# Patient Record
Sex: Male | Born: 1980 | Race: Black or African American | Hispanic: No | Marital: Single | Smoking: Former smoker
Health system: Southern US, Community
[De-identification: ages and names within clinical notes are randomized; demographics above are authoritative.]

## PROBLEM LIST (undated history)

## (undated) ENCOUNTER — Emergency Department (HOSPITAL_COMMUNITY): Admission: EM | Payer: Self-pay | Source: Home / Self Care

## (undated) DIAGNOSIS — N200 Calculus of kidney: Secondary | ICD-10-CM

---

## 2014-04-23 ENCOUNTER — Encounter (HOSPITAL_COMMUNITY): Payer: Self-pay | Admitting: Emergency Medicine

## 2014-04-23 ENCOUNTER — Emergency Department (HOSPITAL_COMMUNITY)
Admission: EM | Admit: 2014-04-23 | Discharge: 2014-04-23 | Disposition: A | Discharge status: Home / Self Care | Payer: Self-pay | Attending: Emergency Medicine | Admitting: Emergency Medicine

## 2014-04-23 DIAGNOSIS — M549 Dorsalgia, unspecified: Secondary | ICD-10-CM | POA: Insufficient documentation

## 2014-04-23 DIAGNOSIS — R109 Unspecified abdominal pain: Secondary | ICD-10-CM

## 2014-04-23 DIAGNOSIS — N1330 Unspecified hydronephrosis: Secondary | ICD-10-CM | POA: Insufficient documentation

## 2014-04-23 DIAGNOSIS — R319 Hematuria, unspecified: Secondary | ICD-10-CM | POA: Insufficient documentation

## 2014-04-23 DIAGNOSIS — R000 Tachycardia, unspecified: Secondary | ICD-10-CM | POA: Insufficient documentation

## 2014-04-23 DIAGNOSIS — R112 Nausea with vomiting, unspecified: Secondary | ICD-10-CM | POA: Insufficient documentation

## 2014-04-23 LAB — URINALYSIS, ROUTINE W REFLEX MICROSCOPIC
Bilirubin Urine: NEGATIVE
Glucose, UA: NEGATIVE mg/dL
Ketones, ur: NEGATIVE mg/dL
NITRITE: NEGATIVE
Protein, ur: 30 mg/dL — AB
SPECIFIC GRAVITY, URINE: 1.017 (ref 1.005–1.030)
UROBILINOGEN UA: 1 mg/dL (ref 0.0–1.0)
pH: 6.5 (ref 5.0–8.0)

## 2014-04-23 LAB — URINE MICROSCOPIC-ADD ON

## 2014-04-23 MED ORDER — KETOROLAC TROMETHAMINE 60 MG/2ML IM SOLN: 60.0000 mg | Freq: Once | INTRAMUSCULAR | Status: DC

## 2014-04-23 MED ORDER — TAMSULOSIN HCL 0.4 MG PO CAPS: 0.4000 mg | ORAL_CAPSULE | Freq: Every day | ORAL | Status: DC

## 2014-04-23 MED ORDER — HYDROCODONE-ACETAMINOPHEN 5-325 MG PO TABS: 2.0000 | ORAL_TABLET | ORAL | Status: DC | PRN

## 2014-04-23 MED ORDER — ONDANSETRON 4 MG PO TBDP
4.0000 mg | ORAL_TABLET | Freq: Once | ORAL | Status: AC
  Administered 2014-04-23: 4 mg via ORAL
  Filled 2014-04-23: qty 1

## 2014-04-23 MED ORDER — ONDANSETRON 4 MG PO TBDP: ORAL_TABLET | ORAL | Status: DC

## 2014-04-23 MED ORDER — IBUPROFEN 800 MG PO TABS
800.0000 mg | ORAL_TABLET | Freq: Once | ORAL | Status: AC
  Administered 2014-04-23: 800 mg via ORAL
  Filled 2014-04-23: qty 1

## 2014-04-23 NOTE — ED Provider Notes (Signed)
CSN: [id number]     Arrival date & time 04/23/14  0231 History   First MD Initiated Contact with Patient 04/23/14 0234     Chief Complaint  Patient presents with  . Flank Pain     (Consider location/radiation/quality/duration/timing/severity/associated sxs/prior Treatment) HPI Comments: 33 year old male with no significant medical history presents with vomiting and right flank pain. Patient has had intermittent right lower back pain for the past couple weeks, worse with movement. Patient has had mild dysuria without hematuria. No blood in stool. No significant alcohol history. Tonight the back pain acutely worsened followed by 3 episodes of nonbloody vomiting.  Patient had close contact with patients with vomiting over week ago. Patient denies recent travel or new foods. No abdominal pain. Mild radiation towards the groin without testicle pain.  Patient is a 33 y.o. male presenting with flank pain. The history is provided by the patient.  Flank Pain Pertinent negatives include no chest pain, no abdominal pain, no headaches and no shortness of breath.    History reviewed. No pertinent past medical history. No past surgical history on file. No family history on file. History  Substance Use Topics  . Smoking status: Not on file  . Smokeless tobacco: Not on file  . Alcohol Use: Not on file    Review of Systems  Constitutional: Negative for fever and chills.  HENT: Negative for congestion.   Respiratory: Negative for shortness of breath.   Cardiovascular: Negative for chest pain.  Gastrointestinal: Positive for nausea and vomiting. Negative for abdominal pain.  Genitourinary: Positive for dysuria and flank pain.  Musculoskeletal: Positive for back pain. Negative for neck pain and neck stiffness.  Skin: Negative for rash.  Neurological: Negative for light-headedness and headaches.      Allergies  Review of patient's allergies indicates no known allergies.  Home Medications    Prior to Admission medications   Not on File   BP 121/76  Pulse 101  Temp(Src) 98.2 F (36.8 C) (Oral)  Resp 16  SpO2 98% Physical Exam  Nursing note and vitals reviewed. Constitutional: He is oriented to person, place, and time. He appears well-developed and well-nourished.  HENT:  Head: Normocephalic and atraumatic.  Mild dry mucous membranes  Eyes: Conjunctivae are normal. Right eye exhibits no discharge. Left eye exhibits no discharge.  Neck: Normal range of motion. Neck supple. No tracheal deviation present.  Cardiovascular: Regular rhythm.  Tachycardia present.   Pulmonary/Chest: Effort normal and breath sounds normal.  Abdominal: Soft. He exhibits no distension. There is no tenderness. There is no guarding.  Musculoskeletal: He exhibits tenderness (mild right flank and right lumbar paraspinal.). He exhibits no edema.  Neurological: He is alert and oriented to person, place, and time.  Skin: Skin is warm. No rash noted.  Psychiatric: He has a normal mood and affect.    ED Course  Procedures (including critical care time) Emergency Focused Ultrasound Exam Limited retroperitoneal ultrasound of kidneys  Performed and interpreted by Dr. Jodi MourningZavitz Indication: flank pain Focused abdominal ultrasound with both kidneys imaged in transverse and longitudinal planes in real-time. Interpretation: Mild right hydronephrosis visualized.  No stones or cysts visualized  Images archived electronically  Labs Review Labs Reviewed  URINALYSIS, ROUTINE W REFLEX MICROSCOPIC - Abnormal; Notable for the following:    Hgb urine dipstick LARGE (*)    Protein, ur 30 (*)    Leukocytes, UA TRACE (*)    All other components within normal limits  URINE MICROSCOPIC-ADD ON    Imaging Review  No results found.   EKG Interpretation None      MDM   Final diagnoses:  Right flank pain  Hematuria  Hydronephrosis, right   Clinically patient with kidney stone versus musculoskeletal versus  gastritis versus other. Plan for Toradol, Zofran, fluid challenge and bedside ultrasound of the kidneys. No abdominal pain at this time to suggest appendicitis.  Patient with hematuria right flank pain and mild hydronephrosis on ultrasound. Pain and vomiting control. No indication for CT scan at this time with risks involved of cost and radiation. Supportive care and followup with primary doctor and neurology discussed. Strict reasons to return given.  Results and differential diagnosis were discussed with the patient. Close follow up outpatient was discussed, patient comfortable with the plan.   Filed Vitals:   04/23/14 0236 04/23/14 0245  BP: 134/91 121/76  Pulse: 107 101  Temp: 98.2 F (36.8 C)   TempSrc: Oral   Resp: 16   SpO2: 99% 98%      Enid SkeensJoshua M Kayann Maj, MD 04/23/14 [phone]

## 2014-04-23 NOTE — Discharge Instructions (Signed)
Take ibuprofen 600 mg every 6 hours for pain. For severe pain take norco or vicodin however realize they have the potential for addiction and it can make you sleepy and has tylenol in it.  No operating machinery while taking. Return for fevers, uncontrollable pain or new symptoms. Followup with urology if no improvement. Stable hydrated with water and strain her urine.  If you were given medicines take as directed.  If you are on coumadin or contraceptives realize their levels and effectiveness is altered by many different medicines.  If you have any reaction (rash, tongues swelling, other) to the medicines stop taking and see a physician.   Please follow up as directed and return to the ER or see a physician for new or worsening symptoms.  Thank you. Filed Vitals:   04/23/14 0236 04/23/14 0245  BP: 134/91 121/76  Pulse: 107 101  Temp: 98.2 F (36.8 C)   TempSrc: Oral   Resp: 16   SpO2: 99% 98%    Flank Pain Flank pain is pain in your side. The flank is the area of your side between your upper belly (abdomen) and your back. Pain in this area can be caused by many different things. HOME CARE Home care and treatment will depend on the cause of your pain.  Rest as told by your doctor.  Drink enough fluids to keep your pee (urine) clear or pale yellow.  Only take medicine as told by your doctor.  Tell your doctor about any changes in your pain.  Follow up with your doctor. GET HELP RIGHT AWAY IF:   Your pain does not get better with medicine.   You have new symptoms or your symptoms get worse.  Your pain gets worse.   You have belly (abdominal) pain.   You are short of breath.   You always feel sick to your stomach (nauseous).   You keep throwing up (vomiting).   You have puffiness (swelling) in your belly.   You feel lightheaded or you pass out (faint).   You have blood in your pee.  You have a fever or lasting symptoms for more than 2 3 days.  You have  a fever and your symptoms suddenly get worse. MAKE SURE YOU:   Understand these instructions.  Will watch your condition.  Will get help right away if you are not doing well or get worse. Document Released: 09/20/2008 Document Revised: 09/05/2012 Document Reviewed: 07/26/2012 [facility] Patient Information 2014 [place], [place].

## 2014-04-23 NOTE — ED Notes (Signed)
Pt c/o intermittent right flank pain for a couple of weeks. Pt reports intermittent dysuria, denies hematuria. Pt reports normal BM's. Pt states he has vomited x 3 today.

## 2014-04-23 NOTE — ED Notes (Signed)
MD at bedside. 

## 2014-04-25 ENCOUNTER — Emergency Department (HOSPITAL_COMMUNITY)
Admission: EM | Admit: 2014-04-25 | Discharge: 2014-04-25 | Disposition: A | Discharge status: Home / Self Care | Payer: Self-pay | Attending: Emergency Medicine | Admitting: Emergency Medicine

## 2014-04-25 ENCOUNTER — Encounter (HOSPITAL_COMMUNITY): Payer: Self-pay | Admitting: Emergency Medicine

## 2014-04-25 ENCOUNTER — Emergency Department (HOSPITAL_COMMUNITY): Payer: Self-pay

## 2014-04-25 DIAGNOSIS — Z79899 Other long term (current) drug therapy: Secondary | ICD-10-CM | POA: Insufficient documentation

## 2014-04-25 DIAGNOSIS — F17200 Nicotine dependence, unspecified, uncomplicated: Secondary | ICD-10-CM | POA: Insufficient documentation

## 2014-04-25 DIAGNOSIS — N200 Calculus of kidney: Secondary | ICD-10-CM | POA: Insufficient documentation

## 2014-04-25 LAB — URINALYSIS, ROUTINE W REFLEX MICROSCOPIC
Bilirubin Urine: NEGATIVE
Glucose, UA: NEGATIVE mg/dL
Ketones, ur: NEGATIVE mg/dL
NITRITE: NEGATIVE
Protein, ur: 100 mg/dL — AB
SPECIFIC GRAVITY, URINE: 1.026 (ref 1.005–1.030)
UROBILINOGEN UA: 1 mg/dL (ref 0.0–1.0)
pH: 5.5 (ref 5.0–8.0)

## 2014-04-25 LAB — CBC WITH DIFFERENTIAL/PLATELET
BASOS ABS: 0 10*3/uL (ref 0.0–0.1)
BASOS PCT: 1 % (ref 0–1)
Eosinophils Absolute: 0.4 10*3/uL (ref 0.0–0.7)
Eosinophils Relative: 7 % — ABNORMAL HIGH (ref 0–5)
HCT: 42.5 % (ref 39.0–52.0)
HEMOGLOBIN: 14.2 g/dL (ref 13.0–17.0)
Lymphocytes Relative: 18 % (ref 12–46)
Lymphs Abs: 1 10*3/uL (ref 0.7–4.0)
MCH: 29.3 pg (ref 26.0–34.0)
MCHC: 33.4 g/dL (ref 30.0–36.0)
MCV: 87.6 fL (ref 78.0–100.0)
MONOS PCT: 12 % (ref 3–12)
Monocytes Absolute: 0.7 10*3/uL (ref 0.1–1.0)
NEUTROS ABS: 3.6 10*3/uL (ref 1.7–7.7)
Neutrophils Relative %: 64 % (ref 43–77)
PLATELETS: 321 10*3/uL (ref 150–400)
RBC: 4.85 MIL/uL (ref 4.22–5.81)
RDW: 13.9 % (ref 11.5–15.5)
WBC: 5.6 10*3/uL (ref 4.0–10.5)

## 2014-04-25 LAB — COMPREHENSIVE METABOLIC PANEL
ALT: 16 U/L (ref 0–53)
AST: 44 U/L — ABNORMAL HIGH (ref 0–37)
Albumin: 3.3 g/dL — ABNORMAL LOW (ref 3.5–5.2)
Alkaline Phosphatase: 87 U/L (ref 39–117)
BUN: 16 mg/dL (ref 6–23)
CALCIUM: 9.5 mg/dL (ref 8.4–10.5)
CO2: 27 meq/L (ref 19–32)
CREATININE: 1.05 mg/dL (ref 0.50–1.35)
Chloride: 102 mEq/L (ref 96–112)
GFR calc Af Amer: >90 mL/min (ref >90)
Glucose, Bld: 133 mg/dL — ABNORMAL HIGH (ref 70–99)
Potassium: 3.8 mEq/L (ref 3.7–5.3)
Sodium: 141 mEq/L (ref 137–147)
Total Bilirubin: 0.3 mg/dL (ref 0.3–1.2)
Total Protein: 7.8 g/dL (ref 6.0–8.3)

## 2014-04-25 LAB — URINE MICROSCOPIC-ADD ON

## 2014-04-25 MED ORDER — ONDANSETRON 4 MG PO TBDP
8.0000 mg | ORAL_TABLET | Freq: Once | ORAL | Status: AC
  Administered 2014-04-25: 8 mg via ORAL
  Filled 2014-04-25: qty 2

## 2014-04-25 MED ORDER — ONDANSETRON HCL 4 MG/2ML IJ SOLN: 4.0000 mg | Freq: Once | INTRAMUSCULAR | Status: DC

## 2014-04-25 MED ORDER — MORPHINE SULFATE 4 MG/ML IJ SOLN: 4.0000 mg | Freq: Once | INTRAMUSCULAR | Status: DC

## 2014-04-25 MED ORDER — ONDANSETRON 4 MG PO TBDP: 4.0000 mg | ORAL_TABLET | ORAL | Status: DC | PRN

## 2014-04-25 MED ORDER — OXYCODONE-ACETAMINOPHEN 5-325 MG PO TABS
2.0000 | ORAL_TABLET | Freq: Once | ORAL | Status: AC
  Administered 2014-04-25: 2 via ORAL
  Filled 2014-04-25: qty 2

## 2014-04-25 MED ORDER — OXYCODONE-ACETAMINOPHEN 5-325 MG PO TABS: 2.0000 | ORAL_TABLET | ORAL | Status: DC | PRN

## 2014-04-25 NOTE — Discharge Instructions (Signed)
You have a 3 mm kidney stone in your right ureter, between your kidney and bladder.  Usually most stones under 6 mm are able to be passed on their own.  If you have further problems with this stone:  Fevers, pain despite medications, or other new concerning symptoms, please follow up either with the urology office or at the [facility] ER, per the urology group's preference.   Kidney Stones Kidney stones (urolithiasis) are deposits that form inside your kidneys. The intense pain is caused by the stone moving through the urinary tract. When the stone moves, the ureter goes into spasm around the stone. The stone is usually passed in the urine.  CAUSES   A disorder that makes certain neck glands produce too much parathyroid hormone (primary hyperparathyroidism).  A buildup of uric acid crystals, similar to gout in your joints.  Narrowing (stricture) of the ureter.  A kidney obstruction present at birth (congenital obstruction).  Previous surgery on the kidney or ureters.  Numerous kidney infections. SYMPTOMS   Feeling sick to your stomach (nauseous).  Throwing up (vomiting).  Blood in the urine (hematuria).  Pain that usually spreads (radiates) to the groin.  Frequency or urgency of urination. DIAGNOSIS   Taking a history and physical exam.  Blood or urine tests.  CT scan.  Occasionally, an examination of the inside of the urinary bladder (cystoscopy) is performed. TREATMENT   Observation.  Increasing your fluid intake.  Extracorporeal shock wave lithotripsy This is a noninvasive procedure that uses shock waves to break up kidney stones.  Surgery may be needed if you have severe pain or persistent obstruction. There are various surgical procedures. Most of the procedures are performed with the use of small instruments. Only small incisions are needed to accommodate these instruments, so recovery time is minimized. The size, location, and chemical composition are all  important variables that will determine the proper choice of action for you. Talk to your health care provider to better understand your situation so that you will minimize the risk of injury to yourself and your kidney.  HOME CARE INSTRUCTIONS   Drink enough water and fluids to keep your urine clear or pale yellow. This will help you to pass the stone or stone fragments.  Strain all urine through the provided strainer. Keep all particulate matter and stones for your health care provider to see. The stone causing the pain may be as small as a grain of salt. It is very important to use the strainer each and every time you pass your urine. The collection of your stone will allow your health care provider to analyze it and verify that a stone has actually passed. The stone analysis will often identify what you can do to reduce the incidence of recurrences.  Only take over-the-counter or prescription medicines for pain, discomfort, or fever as directed by your health care provider.  Make a follow-up appointment with your health care provider as directed.  Get follow-up X-rays if required. The absence of pain does not always mean that the stone has passed. It may have only stopped moving. If the urine remains completely obstructed, it can cause loss of kidney function or even complete destruction of the kidney. It is your responsibility to make sure X-rays and follow-ups are completed. Ultrasounds of the kidney can show blockages and the status of the kidney. Ultrasounds are not associated with any radiation and can be performed easily in a matter of minutes. SEEK MEDICAL CARE IF:  You  experience pain that is progressive and unresponsive to any pain medicine you have been prescribed. SEEK IMMEDIATE MEDICAL CARE IF:   Pain cannot be controlled with the prescribed medicine.  You have a fever or shaking chills.  The severity or intensity of pain increases over 18 hours and is not relieved by pain  medicine.  You develop a new onset of abdominal pain.  You feel faint or pass out.  You are unable to urinate. MAKE SURE YOU:   Understand these instructions.  Will watch your condition.  Will get help right away if you are not doing well or get worse. Document Released: 12/12/2005 Document Revised: 08/14/2013 Document Reviewed: 05/15/2013 [facility] Barry RoadExitCare Patient Information 2014 [place]Exit[place], [place].  Ureteral Colic (Kidney Stones) Ureteral colic is the result of a condition when kidney stones form inside the kidney. Once kidney stones are formed they may move into the tube that connects the kidney with the bladder (ureter). If this occurs, this condition may cause pain (colic) in the ureter.  CAUSES  Pain is caused by stone movement in the ureter and the obstruction caused by the stone. SYMPTOMS  The pain comes and goes as the ureter contracts around the stone. The pain is usually intense, sharp, and stabbing in character. The location of the pain may move as the stone moves through the ureter. When the stone is near the kidney the pain is usually located in the back and radiates to the belly (abdomen). When the stone is ready to pass into the bladder the pain is often located in the lower abdomen on the side the stone is located. At this location, the symptoms may mimic those of a urinary tract infection with urinary frequency. Once the stone is located here it often passes into the bladder and the pain disappears completely. TREATMENT   Your caregiver will provide you with medicine for pain relief.  You may require specialized follow-up X-rays.  The absence of pain does not always mean that the stone has passed. It may have just stopped moving. If the urine remains completely obstructed, it can cause loss of kidney function or even complete destruction of the involved kidney. It is your responsibility and in your interest that X-rays and follow-ups as suggested by your caregiver are completed.  Relief of pain without passage of the stone can be associated with severe damage to the kidney, including loss of kidney function on that side.  If your stone does not pass on its own, additional measures may be taken by your caregiver to ensure its removal. HOME CARE INSTRUCTIONS   Increase your fluid intake. Water is the preferred fluid since juices containing vitamin C may acidify the urine making it less likely for certain stones (uric acid stones) to pass.  Strain all urine. A strainer will be provided. Keep all particulate matter or stones for your caregiver to inspect.  Take your pain medicine as directed.  Make a follow-up appointment with your caregiver as directed.  Remember that the goal is passage of your stone. The absence of pain does not mean the stone is gone. Follow your caregiver's instructions.  Only take over-the-counter or prescription medicines for pain, discomfort, or fever as directed by your caregiver. SEEK MEDICAL CARE IF:   Pain cannot be controlled with the prescribed medicine.  You have a fever.  Pain continues for longer than your caregiver advises it should.  There is a change in the pain, and you develop chest discomfort or constant abdominal pain.  You feel faint or pass out. MAKE SURE YOU:   Understand these instructions.  Will watch your condition.  Will get help right away if you are not doing well or get worse. Document Released: 09/21/2005 Document Revised: 04/08/2013 Document Reviewed: 06/08/2011 [facility] Patient Information 2014 [place]ExitCare, [place]LL[place].  Urine Strainer This strainer is used to catch or filter out any stones found in your urine. Place the strainer under your urine stream. Save any stones or objects that you find in your urine. Place them in a plastic or glass container to show your caregiver. The stones vary in size - some can be very small, so make sure you check the strainer carefully. Your caregiver may send the stone to the  lab. When the results are back, your caregiver may recommend medicines or diet changes.  Document Released: 09/16/2004 Document Revised: 03/05/2012 Document Reviewed: 10/24/2008 [facility]Exit[facility] Patient Information 2014 [place]ExitCare, [place]LL[place].

## 2014-04-25 NOTE — ED Notes (Signed)
Patient returned from CT

## 2014-04-25 NOTE — ED Notes (Signed)
Unresolved rt. Flank pain. Here x 2 days ago. Prescribed vicodin and taking them.

## 2014-04-25 NOTE — ED Notes (Signed)
Patient transported to CT 

## 2014-04-25 NOTE — ED Provider Notes (Signed)
CSN: [id number]     Arrival date & time 04/25/14  0407 History   First MD Initiated Contact with Patient 04/25/14 0501     Chief Complaint  Patient presents with  . Flank Pain     (Consider location/radiation/quality/duration/timing/severity/associated sxs/prior Treatment) HPI 33 year old male presents to emergency department from home with complaint of persistent right flank and right lower quadrant pain.  Patient was seen here 2 days ago, had ultrasound with hydronephrosis and probable kidney stone.  Patient reports he's been taking the Vicodin and Zofran as prescribed, but pain has not let off and is worsening slightly.  No fevers no chills. History reviewed. No pertinent past medical history. History reviewed. No pertinent past surgical history. No family history on file. History  Substance Use Topics  . Smoking status: Current Every Day Smoker  . Smokeless tobacco: Not on file  . Alcohol Use: No    Review of Systems   See History of Present Illness; otherwise all other systems are reviewed and negative  Allergies  Review of patient's allergies indicates no known allergies.  Home Medications   Prior to Admission medications   Medication Sig Start Date End Date Taking? Authorizing Provider  HYDROcodone-acetaminophen (NORCO) 5-325 MG per tablet Take 2 tablets by mouth every 4 (four) hours as needed. 04/23/14   Enid SkeensJoshua M Zavitz, MD  ondansetron (ZOFRAN ODT) 4 MG disintegrating tablet 4mg  ODT q4 hours prn nausea/vomit 04/23/14   Enid SkeensJoshua M Zavitz, MD  tamsulosin (FLOMAX) 0.4 MG CAPS capsule Take 1 capsule (0.4 mg total) by mouth daily. 04/23/14   Enid SkeensJoshua M Zavitz, MD   BP 130/71  Pulse 114  Temp(Src) 98.3 F (36.8 C) (Oral)  Resp 16  SpO2 96% Physical Exam  Nursing note and vitals reviewed. Constitutional: He is oriented to person, place, and time. He appears well-developed and well-nourished. He appears distressed (uncomfortable appearing).  HENT:  Head: Normocephalic and  atraumatic.  Nose: Nose normal.  Mouth/Throat: Oropharynx is clear and moist.  Eyes: Conjunctivae and EOM are normal. Pupils are equal, round, and reactive to light.  Neck: Normal range of motion. Neck supple. No JVD present. No tracheal deviation present. No thyromegaly present.  Cardiovascular: Normal rate, regular rhythm, normal heart sounds and intact distal pulses.  Exam reveals no gallop and no friction rub.   No murmur heard. Pulmonary/Chest: Effort normal and breath sounds normal. No stridor. No respiratory distress. He has no wheezes. He has no rales. He exhibits no tenderness.  Abdominal: Soft. Bowel sounds are normal. He exhibits no distension and no mass. There is tenderness (patient has moderate tenderness in right lower quadrant). There is no rebound and no guarding.  Musculoskeletal: Normal range of motion. He exhibits no edema and no tenderness.  Lymphadenopathy:    He has no cervical adenopathy.  Neurological: He is alert and oriented to person, place, and time. He exhibits normal muscle tone. Coordination normal.  Skin: Skin is warm and dry. No rash noted. No erythema. No pallor.  Psychiatric: He has a normal mood and affect. His behavior is normal. Judgment and thought content normal.    ED Course  Procedures (including critical care time) Labs Review Labs Reviewed  COMPREHENSIVE METABOLIC PANEL - Abnormal; Notable for the following:    Glucose, Bld 133 (*)    Albumin 3.3 (*)    AST 44 (*)    All other components within normal limits  CBC WITH DIFFERENTIAL - Abnormal; Notable for the following:    Eosinophils Relative 7 (*)  All other components within normal limits  URINALYSIS, ROUTINE W REFLEX MICROSCOPIC - Abnormal; Notable for the following:    Hgb urine dipstick MODERATE (*)    Protein, ur 100 (*)    Leukocytes, UA SMALL (*)    All other components within normal limits  URINE MICROSCOPIC-ADD ON - Abnormal; Notable for the following:    Casts HYALINE CASTS  (*)    Crystals CA OXALATE CRYSTALS (*)    All other components within normal limits    Imaging Review Ct Abdomen Pelvis Wo Contrast  04/25/2014   CLINICAL DATA:  Right flank pain, radiating anteriorly.  EXAM: CT ABDOMEN AND PELVIS WITHOUT CONTRAST  TECHNIQUE: Multidetector CT imaging of the abdomen and pelvis was performed following the standard protocol without intravenous contrast.  COMPARISON:  None.  FINDINGS: The visualized lung bases are clear.  The liver and spleen are unremarkable in appearance. The gallbladder is within normal limits. The pancreas and adrenal glands are unremarkable.  There is minimal right-sided hydronephrosis, with prominence of the proximal ureter, to the level of an obstructing 3 mm stone in the proximal right ureter, 5 cm inferior to the right renal pelvis. No nonobstructing renal stones are identified. A 1.7 cm hypodensity within the left kidney likely reflects a cyst.  No free fluid is identified. The small bowel is unremarkable in appearance. The stomach is within normal limits. No acute vascular abnormalities are seen.  The appendix is normal in caliber and contains air, without evidence for appendicitis. The colon is unremarkable in appearance.  The bladder is largely decompressed and grossly unremarkable in appearance. The prostate remains normal in size. Prominent bilateral inguinal nodes are seen, measuring up to 1.3 cm in short axis. These would be amenable to biopsy, as they are relatively superficial in nature.  No acute osseous abnormalities are identified.  IMPRESSION: 1. Minimal right-sided hydronephrosis, with an obstructing 3 mm stone noted in the proximal right ureter, 5 cm inferior to the right renal pelvis. 2. Likely small hepatic cysts and left renal cyst noted. 3. Prominent bilateral inguinal nodes seen, of uncertain significance. These would be amenable to biopsy, as deemed clinically appropriate.   Electronically Signed   By: Roanna RaiderJeffery  Chang M.D.   On:  04/25/2014 06:19     EKG Interpretation None      MDM   Final diagnoses:  Kidney stone on right side   33 year old male with persistent flank and right lower quadrant pain.  At this time as he is still having symptoms, we'll get CT abdomen pelvis to assess for kidney stone size and location.  Patient is resistant to the idea of receiving an IV, and will treat with Percocet to see if that helps with his pain.    Olivia Mackielga M Jacobi Ryant, MD 04/25/14 [phone]

## 2016-03-16 ENCOUNTER — Emergency Department (HOSPITAL_COMMUNITY)
Admission: EM | Admit: 2016-03-16 | Discharge: 2016-03-16 | Disposition: A | Discharge status: Home / Self Care | Payer: Self-pay | Attending: Emergency Medicine | Admitting: Emergency Medicine

## 2016-03-16 ENCOUNTER — Encounter (HOSPITAL_COMMUNITY): Payer: Self-pay | Admitting: Emergency Medicine

## 2016-03-16 ENCOUNTER — Emergency Department (HOSPITAL_COMMUNITY): Payer: Self-pay

## 2016-03-16 DIAGNOSIS — N201 Calculus of ureter: Secondary | ICD-10-CM | POA: Insufficient documentation

## 2016-03-16 DIAGNOSIS — R319 Hematuria, unspecified: Secondary | ICD-10-CM

## 2016-03-16 DIAGNOSIS — F17200 Nicotine dependence, unspecified, uncomplicated: Secondary | ICD-10-CM | POA: Insufficient documentation

## 2016-03-16 DIAGNOSIS — Z79899 Other long term (current) drug therapy: Secondary | ICD-10-CM | POA: Insufficient documentation

## 2016-03-16 LAB — BASIC METABOLIC PANEL
Anion gap: 11 (ref 5–15)
BUN: 16 mg/dL (ref 6–20)
CHLORIDE: 104 mmol/L (ref 101–111)
CO2: 24 mmol/L (ref 22–32)
Calcium: 9 mg/dL (ref 8.9–10.3)
Creatinine, Ser: 0.9 mg/dL (ref 0.61–1.24)
GFR calc non Af Amer: >60 mL/min (ref >60)
Glucose, Bld: 86 mg/dL (ref 65–99)
Potassium: 4 mmol/L (ref 3.5–5.1)
Sodium: 139 mmol/L (ref 135–145)

## 2016-03-16 LAB — CBC WITH DIFFERENTIAL/PLATELET
Basophils Absolute: 0 10*3/uL (ref 0.0–0.1)
Basophils Relative: 0 %
Eosinophils Absolute: 0.1 10*3/uL (ref 0.0–0.7)
Eosinophils Relative: 1 %
HCT: 45.2 % (ref 39.0–52.0)
Hemoglobin: 14.2 g/dL (ref 13.0–17.0)
LYMPHS ABS: 1.1 10*3/uL (ref 0.7–4.0)
Lymphocytes Relative: 16 %
MCH: 27.8 pg (ref 26.0–34.0)
MCHC: 31.4 g/dL (ref 30.0–36.0)
MCV: 88.6 fL (ref 78.0–100.0)
Monocytes Absolute: 0.5 10*3/uL (ref 0.1–1.0)
Monocytes Relative: 8 %
Neutro Abs: 4.9 10*3/uL (ref 1.7–7.7)
Neutrophils Relative %: 75 %
Platelets: 321 10*3/uL (ref 150–400)
RBC: 5.1 MIL/uL (ref 4.22–5.81)
RDW: 14.5 % (ref 11.5–15.5)
WBC: 6.6 10*3/uL (ref 4.0–10.5)

## 2016-03-16 LAB — URINALYSIS, ROUTINE W REFLEX MICROSCOPIC
Bilirubin Urine: NEGATIVE
Glucose, UA: NEGATIVE mg/dL
Ketones, ur: NEGATIVE mg/dL
Nitrite: NEGATIVE
Protein, ur: 100 mg/dL — AB
Specific Gravity, Urine: 1.02 (ref 1.005–1.030)
pH: 6 (ref 5.0–8.0)

## 2016-03-16 LAB — URINE MICROSCOPIC-ADD ON

## 2016-03-16 MED ORDER — ONDANSETRON 4 MG PO TBDP
4.0000 mg | ORAL_TABLET | Freq: Three times a day (TID) | ORAL | Status: DC | PRN
Start: 2016-03-16 — End: 2020-07-02

## 2016-03-16 MED ORDER — OXYCODONE-ACETAMINOPHEN 5-325 MG PO TABS
2.0000 | ORAL_TABLET | Freq: Once | ORAL | Status: AC
  Administered 2016-03-16: 2 via ORAL
  Filled 2016-03-16: qty 2

## 2016-03-16 MED ORDER — ONDANSETRON 4 MG PO TBDP
4.0000 mg | ORAL_TABLET | Freq: Once | ORAL | Status: AC
  Administered 2016-03-16: 4 mg via ORAL
  Filled 2016-03-16: qty 1

## 2016-03-16 MED ORDER — OXYCODONE-ACETAMINOPHEN 5-325 MG PO TABS: 1.0000 | ORAL_TABLET | ORAL | Status: DC | PRN

## 2016-03-16 MED ORDER — TAMSULOSIN HCL 0.4 MG PO CAPS: 0.4000 mg | ORAL_CAPSULE | Freq: Every day | ORAL | Status: DC

## 2016-03-16 NOTE — ED Provider Notes (Signed)
CSN: [id number]     Arrival date & time 03/16/16  1310 History   First MD Initiated Contact with Patient 03/16/16 1626     Chief Complaint  Patient presents with  . Flank Pain     (Consider location/radiation/quality/duration/timing/severity/associated sxs/prior Treatment) Patient is a 35 y.o. male presenting with flank pain. The history is provided by the patient and medical records.  Flank Pain   35 year old male with history of kidney stones, presenting to the ED for right flank pain which began abruptly this morning. He states pain is in his right back and radiates to the right groin. He states he has noted some hematuria but denies any dysuria or urethral discharge. No testicle pain.  He did have some vomiting earlier this morning, but has been able to tolerate food and fluids throughout the remainder of the day.  No fever or chills. Patient does have history of kidney stones in the past, this passed spontaneously.  History reviewed. No pertinent past medical history. History reviewed. No pertinent past surgical history. No family history on file. Social History  Substance Use Topics  . Smoking status: Current Every Day Smoker  . Smokeless tobacco: None  . Alcohol Use: No    Review of Systems  Genitourinary: Positive for hematuria and flank pain.  All other systems reviewed and are negative.     Allergies  Review of patient's allergies indicates no known allergies.  Home Medications   Prior to Admission medications   Medication Sig Start Date End Date Taking? Authorizing Provider  ondansetron (ZOFRAN-ODT) 4 MG disintegrating tablet Take 1 tablet (4 mg total) by mouth every 4 (four) hours as needed for nausea or vomiting. 04/25/14   Marisa Severin, MD  oxyCODONE-acetaminophen (PERCOCET/ROXICET) 5-325 MG per tablet Take 2 tablets by mouth every 4 (four) hours as needed for severe pain. 04/25/14   Marisa Severin, MD  tamsulosin (FLOMAX) 0.4 MG CAPS capsule Take 1 capsule (0.4 mg  total) by mouth daily. 04/23/14   Blane Ohara, MD   BP 126/81 mmHg  Pulse 85  Temp(Src) 98.2 F (36.8 C) (Oral)  Resp 14  SpO2 100%   Physical Exam  Constitutional: He is oriented to person, place, and time. He appears well-developed and well-nourished. No distress.  HENT:  Head: Normocephalic and atraumatic.  Mouth/Throat: Oropharynx is clear and moist.  Eyes: Conjunctivae and EOM are normal. Pupils are equal, round, and reactive to light.  Neck: Normal range of motion. Neck supple.  Cardiovascular: Normal rate, regular rhythm and normal heart sounds.   Pulmonary/Chest: Effort normal and breath sounds normal. No respiratory distress. He has no wheezes.  Abdominal: Soft. Bowel sounds are normal. There is no tenderness. There is CVA tenderness (right). There is no guarding.  Right CVA tenderness with radiation to right groin; no palpable masses noted; no appreciable hernia  Musculoskeletal: Normal range of motion. He exhibits no edema.  Neurological: He is alert and oriented to person, place, and time.  Skin: Skin is warm and dry. He is not diaphoretic.  Psychiatric: He has a normal mood and affect.  Nursing note and vitals reviewed.   ED Course  Procedures (including critical care time) Labs Review Labs Reviewed  URINALYSIS, ROUTINE W REFLEX MICROSCOPIC (NOT AT [facility]) - Abnormal; Notable for the following:    Color, Urine AMBER (*)    APPearance CLOUDY (*)    Hgb urine dipstick LARGE (*)    Protein, ur 100 (*)    Leukocytes, UA SMALL (*)  All other components within normal limits  URINE MICROSCOPIC-ADD ON - Abnormal; Notable for the following:    Squamous Epithelial / LPF 0-5 (*)    Bacteria, UA RARE (*)    All other components within normal limits  CBC WITH DIFFERENTIAL/PLATELET  BASIC METABOLIC PANEL    Imaging Review Ct Renal Stone Study  03/16/2016  CLINICAL DATA:  Acute onset right flank pain yesterday. Nephrolithiasis. EXAM: CT ABDOMEN AND PELVIS WITHOUT  CONTRAST TECHNIQUE: Multidetector CT imaging of the abdomen and pelvis was performed following the standard protocol without IV contrast. COMPARISON:  04/25/2014 FINDINGS: Lower chest: No acute findings. Persistent bibasilar pulmonary interstitial prominence noted. Hepatobiliary: No mass visualized on this un-enhanced exam. Pancreas: No mass or inflammatory process identified on this un-enhanced exam. Spleen: Within normal limits in size. Adrenals/Urinary Tract: 2 mm nonobstructive calculus seen in the midpole of the right kidney. Moderate right hydronephrosis and ureterectasis is seen. There is a 5 mm calculus in the mid right ureter on image 62. No bladder calculi visualized. No evidence of left renal calculi or hydronephrosis. Small fluid attenuation left renal cyst remains stable. Stomach/Bowel: No evidence of obstruction, inflammatory process, or abnormal fluid collections. Vascular/Lymphatic: Mild bilateral external iliac and inguinal lymphadenopathy is again seen, without significant change. Index lymph node in the right external iliac chain measures 1.9 cm on image 79, and index lymph node in the left external iliac chain measures 1.5 cm on image 77. No evidence of abdominal aortic aneurysm. Reproductive: No mass or other significant abnormality. Other: None. Musculoskeletal:  No suspicious bone lesions identified. IMPRESSION: Increased moderate right hydronephrosis due to 5 mm calculus in the mid right ureter. 2 mm nonobstructive right renal calculus also noted. Stable mild bilateral external iliac and inguinal lymphadenopathy since 2015 exam, which remains nonspecific but likely benign in etiology. Electronically Signed   By: Myles RosenthalJohn  Stahl M.D.   On: 03/16/2016 17:48   I have personally reviewed and evaluated these images and lab results as part of my medical decision-making.   EKG Interpretation None      MDM   Final diagnoses:  Right ureteral calculus  Hematuria   35 year old male here with  right flank pain. History of kidney stones in the past. Patient afebrile, nontoxic. He does have some right CVA tenderness with radiation to his right groin. He denies any testicle pain. UA with large blood. Labwork is reassuring. CT revealing 5 mm right midureteral calculus which is likely the source of his symptoms.  Has tolerated oral meds and fluids here without difficulty, pain controlled currently.  VSS. D/c home with urine strainer, percocet, zofran, flomax.  FU with urology given.  Discussed plan with patient, he/she acknowledged understanding and agreed with plan of care.  Return precautions given for new or worsening symptoms.  Garlon HatchetLisa M Daryel Kenneth, PA-C 03/16/16 1835  Garlon HatchetLisa M Blythe Hartshorn, PA-C 03/16/16 1836  Melene Planan Floyd, DO 03/16/16 1929

## 2016-03-16 NOTE — Progress Notes (Signed)
EDCM spoke to patient at bedside. Patient confirms he [place] have a pcp [place].  [facility], informed patient of services there.  EDCM also provided patient with list of pcps who accept self pay patients, list of discount pharmacies and websites needymeds.org and GoodRX.com for medication assistance, phone number to inquire about the orange card, phone number to inquire about Medicaid, phone number to inquire about the Affordable Care Act, financial resources in the community such as local churches, [employer], urban ministries, and dental assistance for uninsured patients.  Patient thankful for resources.  No further EDCM needs at this time.  [facility] with coupons from GoodRx.com for zofran, percocet and flomax prescriptions.  Patient agreeable to copay.  No further EDCm needs at this time.

## 2016-03-16 NOTE — Discharge Instructions (Signed)
Take the prescribed medication as directed. Recommend to strain urine at home to monitor for passage of stone. Follow-up with urology if the stone does not pass in the next few days. Return to the ED for new or worsening symptoms.

## 2016-03-16 NOTE — ED Notes (Signed)
Per pt, states right flank pain-history of kidney stones

## 2016-11-07 ENCOUNTER — Encounter (HOSPITAL_COMMUNITY): Payer: Self-pay

## 2016-11-07 ENCOUNTER — Emergency Department (HOSPITAL_COMMUNITY): Payer: Self-pay

## 2016-11-07 ENCOUNTER — Emergency Department (HOSPITAL_COMMUNITY)
Admission: EM | Admit: 2016-11-07 | Discharge: 2016-11-07 | Disposition: A | Discharge status: Home / Self Care | Payer: Self-pay | Attending: Emergency Medicine | Admitting: Emergency Medicine

## 2016-11-07 DIAGNOSIS — N202 Calculus of kidney with calculus of ureter: Secondary | ICD-10-CM | POA: Insufficient documentation

## 2016-11-07 DIAGNOSIS — N200 Calculus of kidney: Secondary | ICD-10-CM

## 2016-11-07 DIAGNOSIS — F17200 Nicotine dependence, unspecified, uncomplicated: Secondary | ICD-10-CM | POA: Insufficient documentation

## 2016-11-07 HISTORY — DX: Calculus of kidney: N20.0

## 2016-11-07 LAB — BASIC METABOLIC PANEL
Anion gap: 9 (ref 5–15)
BUN: 15 mg/dL (ref 6–20)
CHLORIDE: 106 mmol/L (ref 101–111)
CO2: 22 mmol/L (ref 22–32)
CREATININE: 1.36 mg/dL — AB (ref 0.61–1.24)
Calcium: 10.1 mg/dL (ref 8.9–10.3)
GFR calc Af Amer: >60 mL/min (ref >60)
GFR calc non Af Amer: >60 mL/min (ref >60)
Glucose, Bld: 95 mg/dL (ref 65–99)
Potassium: 3.8 mmol/L (ref 3.5–5.1)
Sodium: 137 mmol/L (ref 135–145)

## 2016-11-07 LAB — CBC WITH DIFFERENTIAL/PLATELET
Basophils Absolute: 0 10*3/uL (ref 0.0–0.1)
Basophils Relative: 0 %
EOS ABS: 0.2 10*3/uL (ref 0.0–0.7)
Eosinophils Relative: 2 %
HEMATOCRIT: 45.8 % (ref 39.0–52.0)
HEMOGLOBIN: 15.2 g/dL (ref 13.0–17.0)
LYMPHS ABS: 1 10*3/uL (ref 0.7–4.0)
Lymphocytes Relative: 13 %
MCH: 29.2 pg (ref 26.0–34.0)
MCHC: 33.2 g/dL (ref 30.0–36.0)
MCV: 88.1 fL (ref 78.0–100.0)
MONOS PCT: 7 %
Monocytes Absolute: 0.6 10*3/uL (ref 0.1–1.0)
NEUTROS ABS: 6.3 10*3/uL (ref 1.7–7.7)
NEUTROS PCT: 78 %
Platelets: 336 10*3/uL (ref 150–400)
RBC: 5.2 MIL/uL (ref 4.22–5.81)
RDW: 14.2 % (ref 11.5–15.5)
WBC: 8.2 10*3/uL (ref 4.0–10.5)

## 2016-11-07 LAB — URINE MICROSCOPIC-ADD ON

## 2016-11-07 LAB — URINALYSIS, ROUTINE W REFLEX MICROSCOPIC
GLUCOSE, UA: NEGATIVE mg/dL
Ketones, ur: NEGATIVE mg/dL
NITRITE: NEGATIVE
PH: 5.5 (ref 5.0–8.0)
Protein, ur: 100 mg/dL — AB
SPECIFIC GRAVITY, URINE: 1.029 (ref 1.005–1.030)

## 2016-11-07 MED ORDER — TAMSULOSIN HCL 0.4 MG PO CAPS: 0.4000 mg | ORAL_CAPSULE | Freq: Every day | ORAL | 0 refills | Status: DC

## 2016-11-07 MED ORDER — KETOROLAC TROMETHAMINE 60 MG/2ML IM SOLN: 10.0000 mg | Freq: Once | INTRAMUSCULAR | Status: DC

## 2016-11-07 MED ORDER — OXYCODONE-ACETAMINOPHEN 5-325 MG PO TABS
1.0000 | ORAL_TABLET | ORAL | Status: DC | PRN
  Administered 2016-11-07: 1 via ORAL

## 2016-11-07 MED ORDER — IBUPROFEN 400 MG PO TABS: 400.0000 mg | ORAL_TABLET | Freq: Four times a day (QID) | ORAL | 0 refills | Status: DC | PRN

## 2016-11-07 MED ORDER — OXYCODONE-ACETAMINOPHEN 5-325 MG PO TABS
ORAL_TABLET | ORAL | Status: AC
  Filled 2016-11-07: qty 1

## 2016-11-07 NOTE — ED Triage Notes (Signed)
Pt did not answer.

## 2016-11-07 NOTE — ED Notes (Signed)
Pt. Stated, my pain is better, when name was called he was in CT. Pt. Rates his pain a 8/10. Stated, I was given a pain medication and that made it better.

## 2016-11-07 NOTE — ED Triage Notes (Signed)
Pt presents with 2 day h/o L lower abdominal pain.  Pt reports urine is dark, denies nausea or vomiting, denies dysuria.  Pt reports pain radiates to L flank.

## 2016-11-07 NOTE — ED Provider Notes (Signed)
[facility]-EMERGENCY DEPT Provider Note   CSN: [id number] Arrival date & time: 11/07/16  1130     History   Chief Complaint Chief Complaint  Patient presents with  . Abdominal Pain    HPI Sean Downs is a 35 y.o. male with previous history of kidney stones presenting with left lower quadrant pain 3 days. He characterizes his pain as on/off stabbing pain severity a 10 out of 10. He states these symptoms are similar to his previous tooth kidney stones which both happened less than one year ago. He reports having chills.  He denies chest pain, shortness of breath, nausea, pain, vomiting, diarrhea, constipation, fevers, hematuria, and dysuria.   HPI  Past Medical History:  Diagnosis Date  . Kidney stone     There are no active problems to display for this patient.   History reviewed. No pertinent surgical history.     Home Medications    Prior to Admission medications   Medication Sig Start Date End Date Taking? Authorizing Provider  ibuprofen (ADVIL,MOTRIN) 400 MG tablet Take 1 tablet (400 mg total) by mouth every 6 (six) hours as needed. 11/07/16   Francisco Manuel Espina, [place]  ondansetron (ZOFRAN ODT) 4 MG disintegrating tablet Take 1 tablet (4 mg total) by mouth every 8 (eight) hours as needed for nausea. 03/16/16   Garlon HatchetLisa M Sanders, PA-C  oxyCODONE-acetaminophen (PERCOCET/ROXICET) 5-325 MG tablet Take 1 tablet by mouth every 4 (four) hours as needed. 03/16/16   Garlon HatchetLisa M Sanders, PA-C  tamsulosin (FLOMAX) 0.4 MG CAPS capsule Take 1 capsule (0.4 mg total) by mouth daily. 11/07/16   Francisco Orson AloeManuel Espina, [place]    Family History History reviewed. No pertinent family history.  Social History Social History  Substance Use Topics  . Smoking status: Current Every Day Smoker  . Smokeless tobacco: Never Used  . Alcohol use No     Allergies   Patient has no known allergies.   Review of Systems Review of Systems  Constitutional: Positive for chills. Negative for appetite  change and fever.  Respiratory: Negative for shortness of breath.   Cardiovascular: Negative for chest pain.  Gastrointestinal: Negative for abdominal distention, abdominal pain, constipation, nausea and vomiting.  Genitourinary: Positive for flank pain (Left). Negative for decreased urine volume, difficulty urinating, dysuria and hematuria.  Musculoskeletal: Negative for back pain.  All other systems reviewed and are negative.    Physical Exam Updated Vital Signs BP (!) 169/103 (BP Location: Right Arm)   Pulse 97   Temp 98.3 F (36.8 C) (Oral)   Resp 16   SpO2 100%   Physical Exam  Constitutional: He is oriented to person, place, and time. He appears well-developed and well-nourished.  HENT:  Head: Normocephalic and atraumatic.  Eyes: Conjunctivae and EOM are normal. Pupils are equal, round, and reactive to light.  Neck: Normal range of motion. Neck supple.  Cardiovascular: Normal rate and normal heart sounds.   Pulmonary/Chest: Effort normal and breath sounds normal.  Abdominal: Soft. Bowel sounds are normal. He exhibits no distension, no pulsatile liver, no pulsatile midline mass and no mass. There is tenderness. There is no rebound, no guarding, no tenderness at McBurney's point and negative Murphy's sign.    Neurological: He is alert and oriented to person, place, and time.  Skin: Skin is warm. Capillary refill takes less than 2 seconds.  Psychiatric: He has a normal mood and affect. His behavior is normal.     ED Treatments / Results  Labs (all labs ordered are  listed, but only abnormal results are displayed) Labs Reviewed  URINALYSIS, ROUTINE W REFLEX MICROSCOPIC (NOT AT [facility]) - Abnormal; Notable for the following:       Result Value   Color, Urine AMBER (*)    APPearance CLOUDY (*)    Hgb urine dipstick LARGE (*)    Bilirubin Urine SMALL (*)    Protein, ur 100 (*)    Leukocytes, UA SMALL (*)    All other components within normal limits  BASIC METABOLIC PANEL -  Abnormal; Notable for the following:    Creatinine, Ser 1.36 (*)    All other components within normal limits  URINE MICROSCOPIC-ADD ON - Abnormal; Notable for the following:    Squamous Epithelial / LPF 0-5 (*)    Bacteria, UA MANY (*)    Crystals CA OXALATE CRYSTALS (*)    All other components within normal limits  CBC WITH DIFFERENTIAL/PLATELET    EKG  EKG Interpretation None       Radiology Ct Renal Stone Study  Result Date: 11/07/2016 CLINICAL DATA:  Left flank pain and possible hematuria. EXAM: CT ABDOMEN AND PELVIS WITHOUT CONTRAST TECHNIQUE: Multidetector CT imaging of the abdomen and pelvis was performed following the standard protocol without IV contrast. COMPARISON:  CT abdomen pelvis 03/16/2016 FINDINGS: Lower chest: Bibasilar peribronchovascular nodular opacities and interstitial thickening are again seen. There is cardiophrenic lymphadenopathy measuring up to 8 mm, unchanged. Hepatobiliary: Normal noncontrast appearance of the liver. No visible biliary dilatation. Normal gallbladder. Pancreas: Normal noncontrast appearance of the pancreas. No peripancreatic fluid collection. Spleen: Upper limits normal for size. Adrenal glands: Normal. Urinary Tract: --Right kidney: There are multiple nonobstructing right renal calculi measuring up to 3 mm. No perinephric stranding or hydronephrosis. There is a 2 mm stone within the midportion of the right ureter (axial image 71). --Left kidney: There is a left renal cyst measuring 1.5 cm, unchanged. Small calculus at the midportion of the left kidney measures 2 mm. There is mild left hydronephrosis with peripelvic stranding. There is a 4 mm stone within the distal left ureter, just proximal to the ureterovesical junction. --Urinary bladder: Unremarkable. Stomach/Bowel: No dilated loops of bowel. No evidence of colonic or enteric inflammation. No fluid collection within the abdomen. Vascular/Lymphatic: Normal course and caliber of the major  abdominal vessels. There are multiple subcentimeter para-aortic and aortocaval lymph nodes again seen. Reproductive: Prostate measures upper limits of normal. Musculoskeletal. No focal osseous lesion. Normal visualized extraperitoneal and extrathoracic soft tissues. IMPRESSION: 1. Obstructive left uropathy with 4 mm stone in the distal left ureter with associated mild left hydronephrosis and peripelvic stranding. 2. 2 mm stone within the midportion of the right ureter, without associated hydronephrosis. 3. Bilateral nephrolithiasis. 4. By basilar peribronchovascular nodular opacities and interstitial thickening, little changed from the prior study, but clearly worse compared to 04/25/2014. This suggests underlying chronic lung disease, including the possibility of sarcoidosis or interstitial pneumonia. 0 5. Numerous retroperitoneal lymph nodes, unchanged. Electronically Signed   By: Deatra RobinsonKevin  Herman M.D.   On: 11/07/2016 15:36    Procedures Procedures (including critical care time)  Medications Ordered in ED Medications  oxyCODONE-acetaminophen (PERCOCET/ROXICET) 5-325 MG per tablet 1 tablet (1 tablet Oral Given 11/07/16 1217)  oxyCODONE-acetaminophen (PERCOCET/ROXICET) 5-325 MG per tablet (not administered)  ketorolac (TORADOL) injection 9.9 mg (not administered)     Initial Impression / Assessment and Plan / ED Course  I have reviewed the triage vital signs and the nursing notes.  Pertinent labs & imaging results that were available  during my care of the patient were reviewed by me and considered in my medical decision making (see chart for details).  Clinical Course   Patient is a 35 year old male with previous history of kidney stones presenting with left lower quadrant pain 3 days. He characterizes his pain as on/off stabbing pain severity a 10 out of 10. He states these symptoms are similar to his previous tooth kidney stones which both happened less than one year ago. He reports having  chills.  He denies chest pain, shortness of breath, nausea, back pain, vomiting, diarrhea, constipation, fevers, hematuria, and dysuria.  Patient was tender on the left inguinal area and otherwise normal abdominal exam, no masses, lesions, negative Murphy sign, negative McBurney's sign.  Patient was given hydrocodone which relieved his symptoms of pain. Urinalysis shows calcium oxalate crystals and and CT scan shows evidence of bilateral nephrolithiasis. Left flank 4mm stone and right flank 2mm stone. Patient was asymptomatic on the right flank. Urinalysis and CBC show no evidence of infection, low suspicion of UTI at this time. Creatinine is slightly elevated compared to baseline, but is eating, drinking without difficulty and not showing any signs of acute renal failure. Findings and instructions were discussed with the patient. He understands that he'll be referred to a urologist to further workup the incidence of his kidney stones. He was told to come back to the ER if he had any new or worsening symptoms.  Final Clinical Impressions(s) / ED Diagnoses   Final diagnoses:  Nephrolithiasis    New Prescriptions New Prescriptions   IBUPROFEN (ADVIL,MOTRIN) 400 MG TABLET    Take 1 tablet (400 mg total) by mouth every 6 (six) hours as needed.   TAMSULOSIN (FLOMAX) 0.4 MG CAPS CAPSULE    Take 1 capsule (0.4 mg total) by mouth daily.     [address] [place], [place] 11/07/16 1817    Gerhard Munch, MD 11/08/16 [phone]

## 2016-11-07 NOTE — ED Notes (Signed)
Pt. Stated, My pain is really bad in the back and on the left side. Rates 8/10

## 2018-01-31 IMAGING — CT CT RENAL STONE PROTOCOL
2 of 3 series · 11 of 46 positions shown, 12 images · non-contrast
Comparison: CT abdomen pelvis 03/16/2016

CLINICAL DATA: Left flank pain and possible hematuria.

EXAM:
CT ABDOMEN AND PELVIS WITHOUT CONTRAST
TECHNIQUE: Multidetector CT imaging of the abdomen and pelvis was performed
following the standard protocol without IV contrast.

[Series 301: stone study, idose (2) · axial · 0.68mm/px · z∈[+340,+775]mm · 8 of 101 slices shown, 9 images]
[im 7/101  soft-tissue]
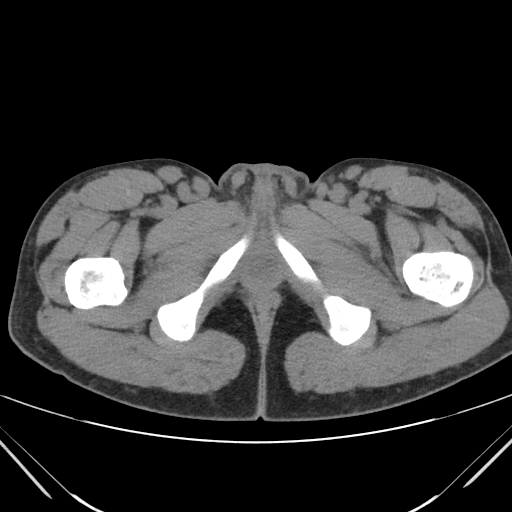
[im 7/101  bone]
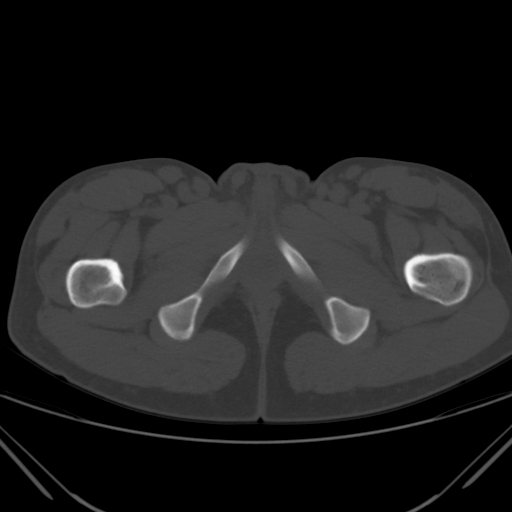
[im 20/101  soft-tissue]
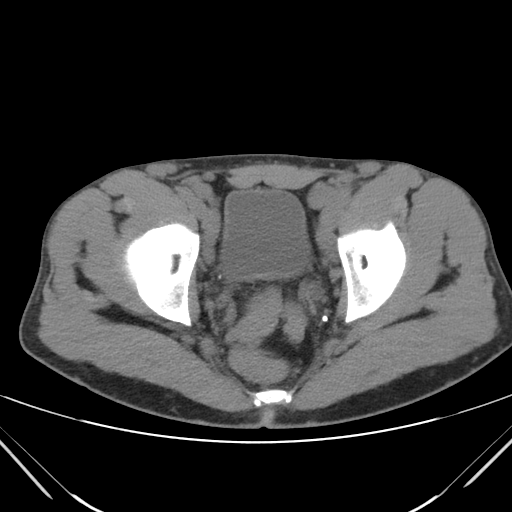
[im 33/101  soft-tissue]
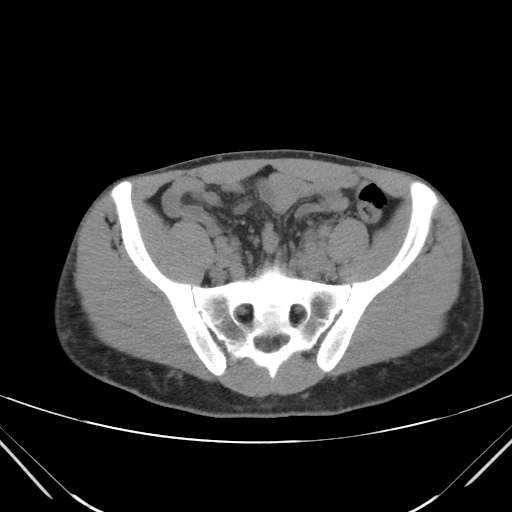
[im 46/101  soft-tissue]
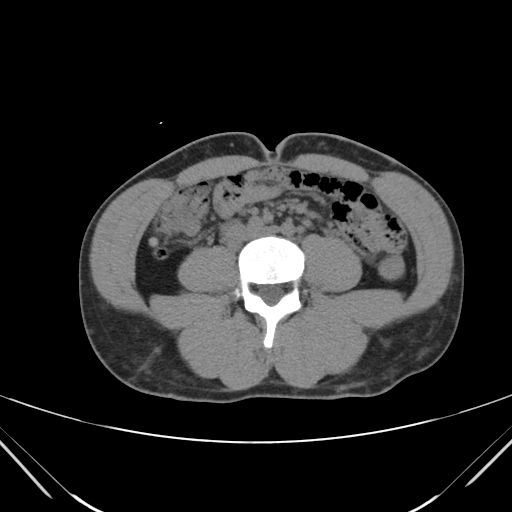
[im 55/101  soft-tissue]
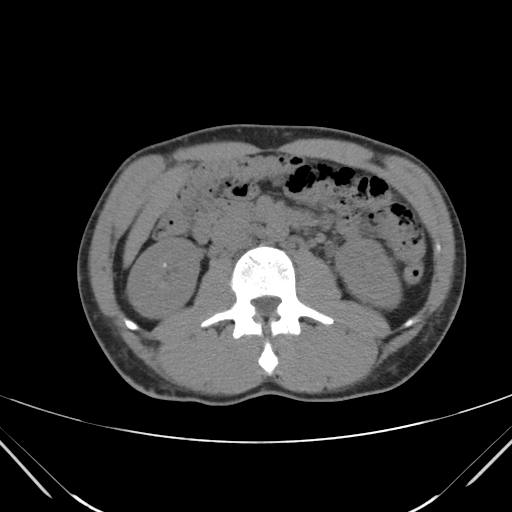
[im 68/101  soft-tissue]
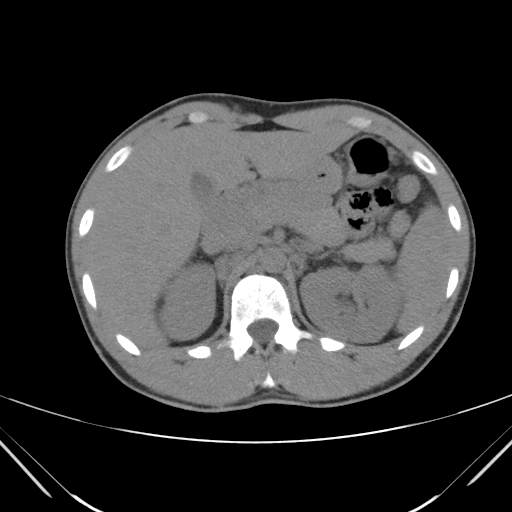
[im 81/101  soft-tissue]
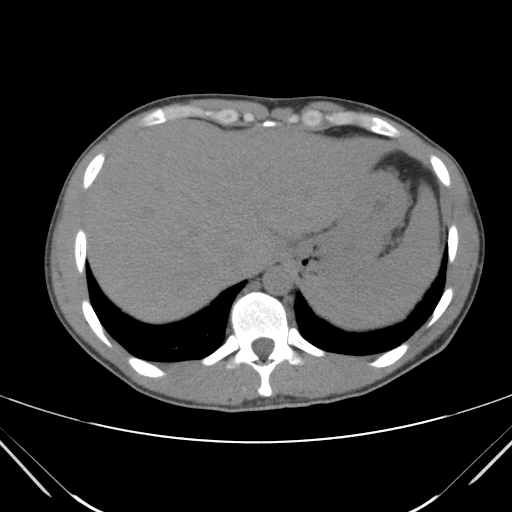
[im 94/101  soft-tissue]
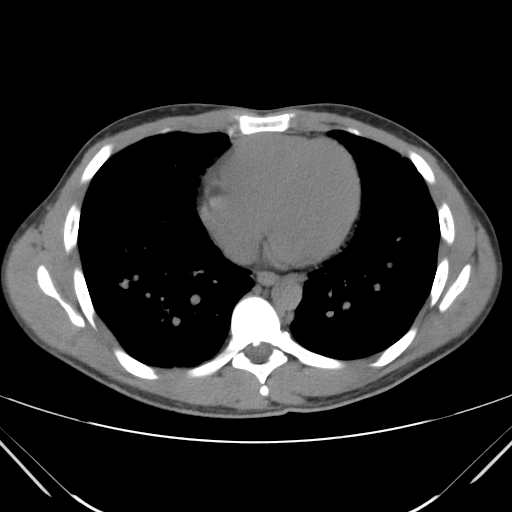

[Series 303: coronals, idose (2) · coronal · 0.45mm/px · 3 of 100 slices shown]
[im 34/100  soft-tissue]
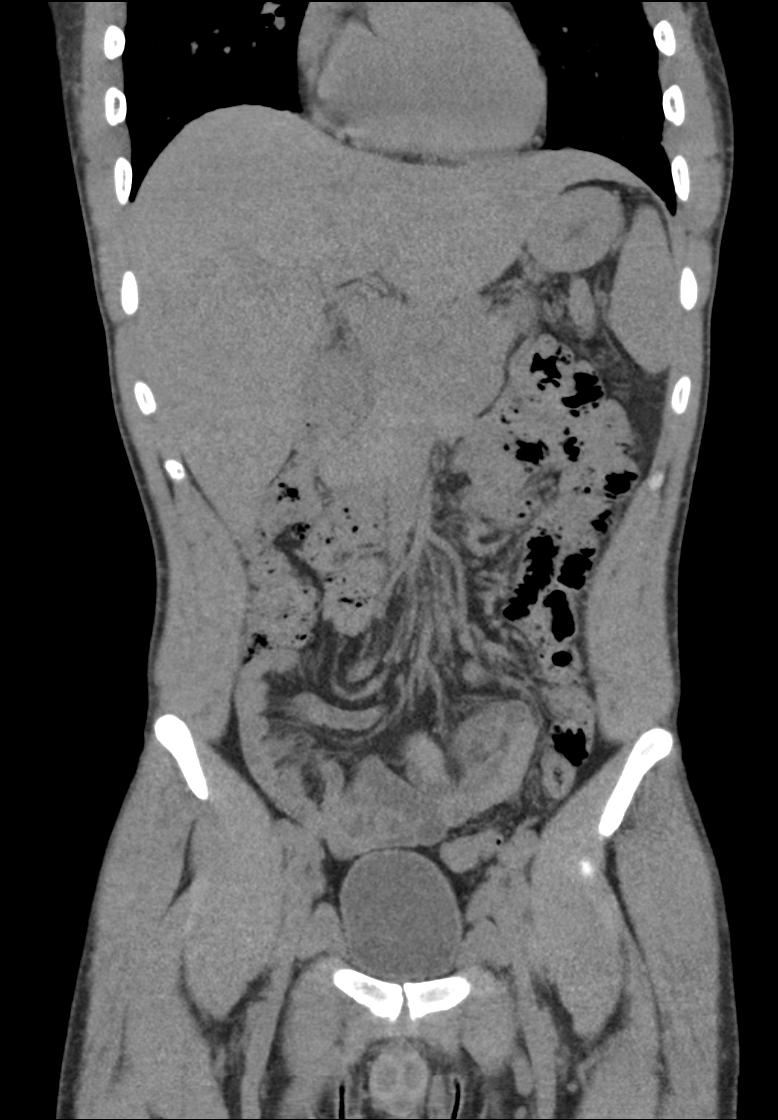
[im 45/100  soft-tissue]
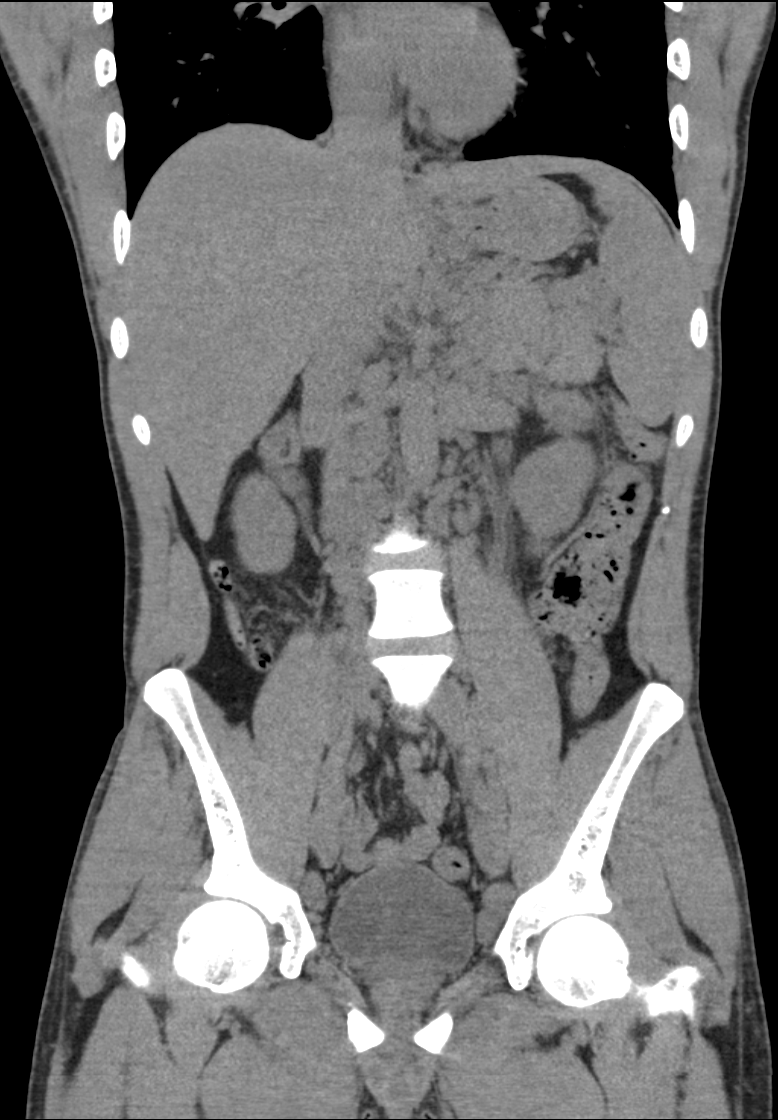
[im 56/100  soft-tissue]
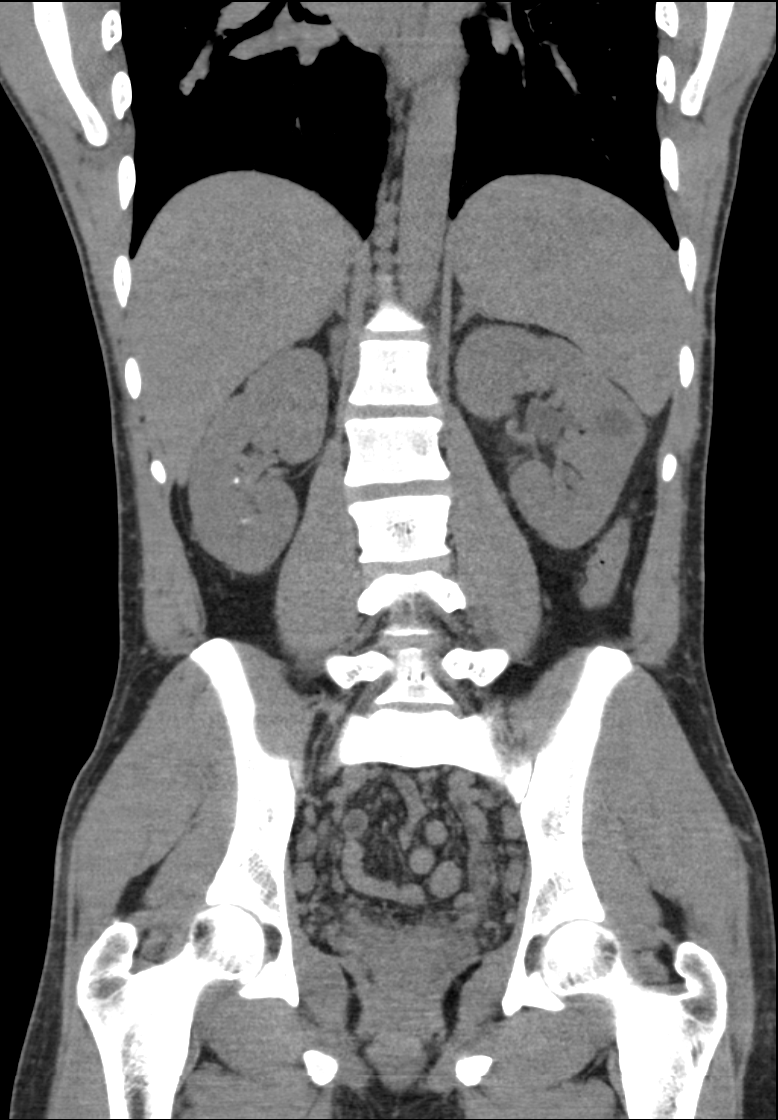

[11 of 46 positions shown; findings below may reference images not displayed]

FINDINGS: Lower chest: Bibasilar peribronchovascular nodular opacities and
interstitial thickening are again seen. There is cardiophrenic
lymphadenopathy measuring up to 8 mm, unchanged.

Hepatobiliary: Normal noncontrast appearance of the liver. No
visible biliary dilatation. Normal gallbladder.

Pancreas: Normal noncontrast appearance of the pancreas. No
peripancreatic fluid collection.

Spleen: Upper limits normal for size.

Adrenal glands: Normal.

Urinary Tract:

--Right kidney: There are multiple nonobstructing right renal
calculi measuring up to 3 mm. No perinephric stranding or
hydronephrosis. There is a 2 mm stone within the midportion of the
right ureter (axial image 71).

--Left kidney: There is a left renal cyst measuring 1.5 cm,
unchanged. Small calculus at the midportion of the left kidney
measures 2 mm. There is mild left hydronephrosis with peripelvic
stranding. There is a 4 mm stone within the distal left ureter, just
proximal to the ureterovesical junction.

--Urinary bladder: Unremarkable.

Stomach/Bowel: No dilated loops of bowel. No evidence of colonic or
enteric inflammation. No fluid collection within the abdomen.

Vascular/Lymphatic: Normal course and caliber of the major abdominal
vessels. There are multiple subcentimeter para-aortic and aortocaval
lymph nodes again seen.

Reproductive: Prostate measures upper limits of normal.

Musculoskeletal. No focal osseous lesion. Normal visualized
extraperitoneal and extrathoracic soft tissues.
IMPRESSION: 1. Obstructive left uropathy with 4 mm stone in the distal left
ureter with associated mild left hydronephrosis and peripelvic
stranding.
2. 2 mm stone within the midportion of the right ureter, without
associated hydronephrosis.
3. Bilateral nephrolithiasis.
4. By basilar peribronchovascular nodular opacities and interstitial
thickening, little changed from the prior study, but clearly worse
compared to 04/25/2014. This suggests underlying chronic lung
disease, including the possibility of sarcoidosis or interstitial
pneumonia. 0
5. Numerous retroperitoneal lymph nodes, unchanged.

## 2020-07-02 ENCOUNTER — Other Ambulatory Visit: Payer: Self-pay

## 2020-07-02 ENCOUNTER — Emergency Department (HOSPITAL_COMMUNITY): Payer: Self-pay

## 2020-07-02 ENCOUNTER — Emergency Department (HOSPITAL_COMMUNITY)
Admission: EM | Admit: 2020-07-02 | Discharge: 2020-07-02 | Disposition: A | Discharge status: Home / Self Care | Payer: Self-pay | Attending: Emergency Medicine | Admitting: Emergency Medicine

## 2020-07-02 ENCOUNTER — Encounter (HOSPITAL_COMMUNITY): Payer: Self-pay | Admitting: Emergency Medicine

## 2020-07-02 DIAGNOSIS — Z79899 Other long term (current) drug therapy: Secondary | ICD-10-CM | POA: Insufficient documentation

## 2020-07-02 DIAGNOSIS — F17210 Nicotine dependence, cigarettes, uncomplicated: Secondary | ICD-10-CM | POA: Insufficient documentation

## 2020-07-02 DIAGNOSIS — R109 Unspecified abdominal pain: Secondary | ICD-10-CM

## 2020-07-02 DIAGNOSIS — N390 Urinary tract infection, site not specified: Secondary | ICD-10-CM | POA: Insufficient documentation

## 2020-07-02 LAB — URINALYSIS, ROUTINE W REFLEX MICROSCOPIC
Bilirubin Urine: NEGATIVE
Glucose, UA: NEGATIVE mg/dL
Ketones, ur: NEGATIVE mg/dL
Nitrite: NEGATIVE
Protein, ur: >=300 mg/dL — AB
RBC / HPF: >50 RBC/hpf — ABNORMAL HIGH (ref 0–5)
Specific Gravity, Urine: 1.025 (ref 1.005–1.030)
WBC, UA: >50 WBC/hpf — ABNORMAL HIGH (ref 0–5)
pH: 6 (ref 5.0–8.0)

## 2020-07-02 LAB — COMPREHENSIVE METABOLIC PANEL
ALT: 22 U/L (ref 0–44)
AST: 48 U/L — ABNORMAL HIGH (ref 15–41)
Albumin: 3.3 g/dL — ABNORMAL LOW (ref 3.5–5.0)
Alkaline Phosphatase: 86 U/L (ref 38–126)
Anion gap: 8 (ref 5–15)
BUN: 11 mg/dL (ref 6–20)
CO2: 23 mmol/L (ref 22–32)
Calcium: 9 mg/dL (ref 8.9–10.3)
Chloride: 102 mmol/L (ref 98–111)
Creatinine, Ser: 1.2 mg/dL (ref 0.61–1.24)
GFR calc Af Amer: >60 mL/min (ref >60)
GFR calc non Af Amer: >60 mL/min (ref >60)
Glucose, Bld: 105 mg/dL — ABNORMAL HIGH (ref 70–99)
Potassium: 4 mmol/L (ref 3.5–5.1)
Sodium: 133 mmol/L — ABNORMAL LOW (ref 135–145)
Total Bilirubin: 0.9 mg/dL (ref 0.3–1.2)
Total Protein: 8.1 g/dL (ref 6.5–8.1)

## 2020-07-02 LAB — CBC
HCT: 42 % (ref 39.0–52.0)
Hemoglobin: 13.7 g/dL (ref 13.0–17.0)
MCH: 29.1 pg (ref 26.0–34.0)
MCHC: 32.6 g/dL (ref 30.0–36.0)
MCV: 89.2 fL (ref 80.0–100.0)
Platelets: 270 10*3/uL (ref 150–400)
RBC: 4.71 MIL/uL (ref 4.22–5.81)
RDW: 13.5 % (ref 11.5–15.5)
WBC: 11.2 10*3/uL — ABNORMAL HIGH (ref 4.0–10.5)
nRBC: 0 % (ref 0.0–0.2)

## 2020-07-02 MED ORDER — IBUPROFEN 400 MG PO TABS: 400.0000 mg | ORAL_TABLET | Freq: Four times a day (QID) | ORAL | 0 refills | Status: DC | PRN

## 2020-07-02 MED ORDER — ACETAMINOPHEN 500 MG PO TABS
1000.0000 mg | ORAL_TABLET | Freq: Once | ORAL | Status: AC
  Administered 2020-07-02: 1000 mg via ORAL
  Filled 2020-07-02: qty 2

## 2020-07-02 MED ORDER — CEPHALEXIN 500 MG PO CAPS: 500.0000 mg | ORAL_CAPSULE | Freq: Three times a day (TID) | ORAL | 0 refills | Status: DC

## 2020-07-02 NOTE — ED Triage Notes (Signed)
Pt c/o left flank pain since yesterday with blood on the urine.

## 2020-07-02 NOTE — Discharge Instructions (Addendum)
You have been evaluated for your flank pain.  CT scan did not show any obvious kidney stone.  However, your symptoms may be due to a recently passed kidney stone.  Your urine shows signs of urine tract infection.  Please take antibiotic as prescribed.  Take Tylenol or ibuprofen as needed for pain or fever.  Return if you have worsening of your symptoms or if you have any concern.

## 2020-07-02 NOTE — ED Provider Notes (Addendum)
[facility] EMERGENCY DEPARTMENT Provider Note   CSN: [id number] Arrival date & time: 07/02/20  1158     History Chief Complaint  Patient presents with  . Flank Pain    Sean Downs is a 39 y.o. male.  The history is provided by the patient. No language interpreter was used.  Flank Pain      39 year old male with known history of kidney stones presenting for evaluation of left flank pain.  Patient report gradual onset of pain to his left flank that radiates towards his left groin ongoing since yesterday afternoon.  Pain is described as a sharp shooting throbbing sensation, waxing waning, became intensified today but has since improved.  He also endorsed some fever and chills sensation.  He does report some mild stinging when urinating and notes some small amount of blood in his urine.  He mention symptoms are similar to prior kidney stone that he has had in the past.  He does not complain of any runny nose sneezing or coughing no chest pain or shortness of breath no nausea vomiting diarrhea no penile discharge no testicle pain or scrotal swelling.  He does not have any history of hernia.  He denies any recent strenuous activities or heavy lifting.  No specific treatment tried.  Past Medical History:  Diagnosis Date  . Kidney stone     There are no problems to display for this patient.   History reviewed. No pertinent surgical history.     No family history on file.  Social History   Tobacco Use  . Smoking status: Current Every Day Smoker  . Smokeless tobacco: Never Used  Substance Use Topics  . Alcohol use: No  . Drug use: Yes    Types: Marijuana    Home Medications Prior to Admission medications   Medication Sig Start Date End Date Taking? Authorizing Provider  ibuprofen (ADVIL,MOTRIN) 400 MG tablet Take 1 tablet (400 mg total) by mouth every 6 (six) hours as needed. 11/07/16   Alvina Chou, PA  ondansetron (ZOFRAN ODT) 4 MG  disintegrating tablet Take 1 tablet (4 mg total) by mouth every 8 (eight) hours as needed for nausea. 03/16/16   Garlon Hatchet, PA-C  oxyCODONE-acetaminophen (PERCOCET/ROXICET) 5-325 MG tablet Take 1 tablet by mouth every 4 (four) hours as needed. 03/16/16   Garlon Hatchet, PA-C  tamsulosin (FLOMAX) 0.4 MG CAPS capsule Take 1 capsule (0.4 mg total) by mouth daily. 11/07/16   Alvina Chou, PA    Allergies    Patient has no known allergies.  Review of Systems   Review of Systems  Genitourinary: Positive for flank pain.  All other systems reviewed and are negative.   Physical Exam Updated Vital Signs BP 120/83   Pulse (!) 102   Temp (!) 102.1 F (38.9 C) (Oral)   Resp 18   SpO2 92%   Physical Exam Vitals and nursing note reviewed.  Constitutional:      General: He is not in acute distress.    Appearance: He is well-developed.  HENT:     Head: Atraumatic.  Eyes:     Conjunctiva/sclera: Conjunctivae normal.  Cardiovascular:     Rate and Rhythm: Normal rate and regular rhythm.     Pulses: Normal pulses.     Heart sounds: Normal heart sounds.  Pulmonary:     Effort: Pulmonary effort is normal.     Breath sounds: Normal breath sounds.  Abdominal:     Palpations: Abdomen is  soft.     Tenderness: There is abdominal tenderness (Mild tenderness to left lower quadrant without guarding or rebound tenderness.  No CVA tenderness.). There is no right CVA tenderness or left CVA tenderness.  Musculoskeletal:     Cervical back: Neck supple.  Skin:    Findings: No rash.  Neurological:     Mental Status: He is alert and oriented to person, place, and time.  Psychiatric:        Mood and Affect: Mood normal.     ED Results / Procedures / Treatments   Labs (all labs ordered are listed, but only abnormal results are displayed) Labs Reviewed  COMPREHENSIVE METABOLIC PANEL - Abnormal; Notable for the following components:      Result Value   Sodium 133 (*)    Glucose, Bld  105 (*)    Albumin 3.3 (*)    AST 48 (*)    All other components within normal limits  CBC - Abnormal; Notable for the following components:   WBC 11.2 (*)    All other components within normal limits  URINALYSIS, ROUTINE W REFLEX MICROSCOPIC - Abnormal; Notable for the following components:   APPearance HAZY (*)    Hgb urine dipstick MODERATE (*)    Protein, ur >=300 (*)    Leukocytes,Ua MODERATE (*)    RBC / HPF >50 (*)    WBC, UA >50 (*)    Bacteria, UA RARE (*)    All other components within normal limits    EKG None  Radiology CT Renal Stone Study  Result Date: 07/02/2020 CLINICAL DATA:  39 year old male with history of left-sided flank pain since yesterday with hematuria. EXAM: CT ABDOMEN AND PELVIS WITHOUT CONTRAST TECHNIQUE: Multidetector CT imaging of the abdomen and pelvis was performed following the standard protocol without IV contrast. COMPARISON:  CT the abdomen and pelvis 11/07/2016. FINDINGS: Lower chest: Unremarkable. Hepatobiliary: No suspicious cystic or solid hepatic lesions. No intra or extrahepatic biliary ductal dilatation. Gallbladder is nearly decompressed, but otherwise unremarkable in appearance. Pancreas: No definite pancreatic mass or peripancreatic fluid collections or inflammatory changes are noted on today's noncontrast CT examination. Spleen: Unremarkable. Adrenals/Urinary Tract: 3-4 mm nonobstructive calculi in the lower pole collecting system of the right kidney. No additional calculi are noted within the left kidney, along the course of either ureter, or within the lumen of the urinary bladder. No hydroureteronephrosis. Unenhanced appearance of the right kidney and bilateral adrenal glands is unremarkable. 2.3 cm low-attenuation lesion in the interpolar region of the left kidney, incompletely characterized on today's non-contrast CT examination, but statistically likely to represent a tiny cyst. Urinary bladder is normal in appearance. Stomach/Bowel:  Unenhanced appearance of the stomach is normal. No pathologic dilatation of small bowel or colon. Normal appendix. Vascular/Lymphatic: No atherosclerotic calcifications are noted in the abdominal aorta or pelvic vasculature. No lymphadenopathy noted in the abdomen or pelvis. Reproductive: Prostate gland and seminal vesicles are unremarkable in appearance. Other: No significant volume of ascites.  No pneumoperitoneum. Musculoskeletal: There are no aggressive appearing lytic or blastic lesions noted in the visualized portions of the skeleton. IMPRESSION: 1. 3-4 mm nonobstructive calculi in the lower pole collecting system of the right kidney. No ureteral stones or findings of urinary tract obstruction are noted at this time. 2. No other acute findings are noted elsewhere in the abdomen or pelvis to account for the patient's symptoms. 3. Additional incidental findings, as above. Electronically Signed   By: Trudie Reed M.D.   On: 07/02/2020 15:47  Procedures Procedures (including critical care time)  Medications Ordered in ED Medications  acetaminophen (TYLENOL) tablet 1,000 mg (1,000 mg Oral Given 07/02/20 1211)    ED Course  I have reviewed the triage vital signs and the nursing notes.  Pertinent labs & imaging results that were available during my care of the patient were reviewed by me and considered in my medical decision making (see chart for details).    MDM Rules/Calculators/A&P                          BP 120/90 (BP Location: Right Arm)   Pulse 99   Temp 99.7 F (37.6 C) (Oral)   Resp 12   SpO2 99%   Final Clinical Impression(s) / ED Diagnoses Final diagnoses:  Left flank pain  Lower urinary tract infectious disease    Rx / DC Orders ED Discharge Orders         Ordered    cephALEXin (KEFLEX) 500 MG capsule  3 times daily     Discontinue  Reprint     07/02/20 1632    ibuprofen (ADVIL) 400 MG tablet  Every 6 hours PRN     Discontinue  Reprint     07/02/20 1632           4:00 PM Patient with history of kidney stones here with left flank pain that started since yesterday but has since improved.  He initially came in with a temperature of 102.1 and mildly tachycardic.  UA obtained today shows moderate hemoglobin and urine dipsticks as well as moderate leukocyte esterase, greater than 50 WBC and rare bacteria.  A CT renal study obtained showed a 4 mm nonobstructive calculi on the lower pole collecting system on the right kidney but no evidence of ureteral stones and no other acute abnormal findings were noted.  Patient does not complain of any testicular pain or scrotal swelling concerning for testicular torsion.  Patient states he is not worried about potential STI.  His abdominal exam is fairly benign.  He may have had passed a kidney stone therefore, will provide symptomatic treatment.  Potential UTI therefore will provide antibiotic as well.  Patient is then to return if his symptoms worsen.   Fayrene Helper, PA-C 07/02/20 1633    Fayrene Helper, PA-C 07/02/20 1634    Charlynne Pander, MD 07/03/20 [phone]

## 2021-09-25 IMAGING — CT CT RENAL STONE PROTOCOL
2 of 4 series · 16 of 46 positions shown, 18 images · non-contrast
Comparison: CT the abdomen and pelvis 11/07/2016.

CLINICAL DATA: 38-year-old male with history of left-sided flank
pain since yesterday with hematuria.

EXAM:
CT ABDOMEN AND PELVIS WITHOUT CONTRAST
TECHNIQUE: Multidetector CT imaging of the abdomen and pelvis was performed
following the standard protocol without IV contrast.

[Series 3: renal stone 5.0 · axial · 0.88mm/px · z∈[+658,+1163]mm · 13 of 111 slices shown, 15 images]
[im 5/111  soft-tissue]
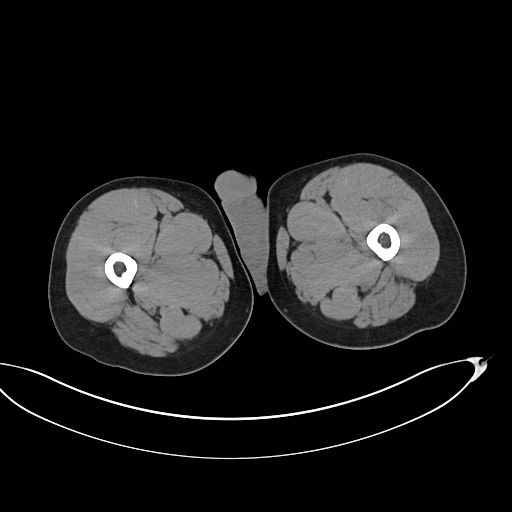
[im 5/111  bone]
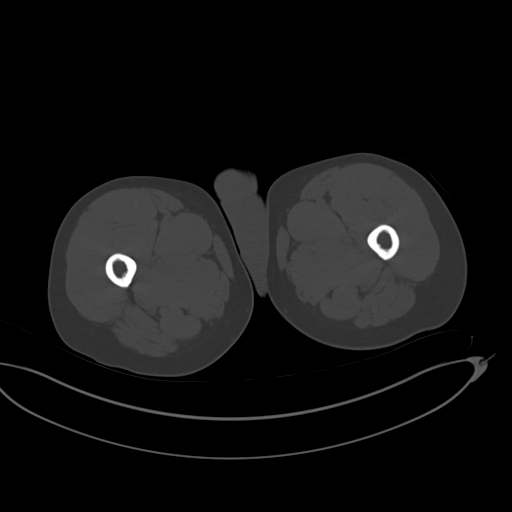
[im 14/111  soft-tissue]
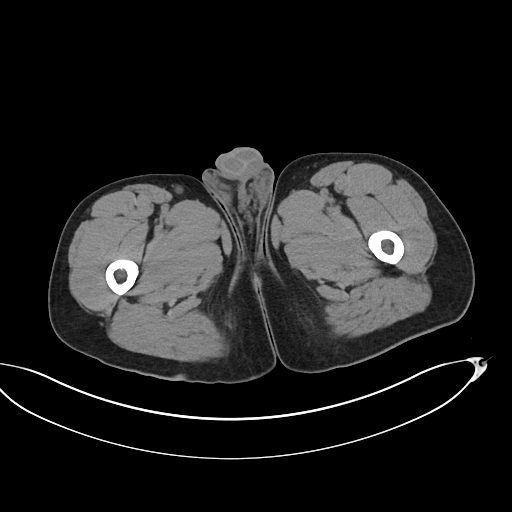
[im 23/111  soft-tissue]
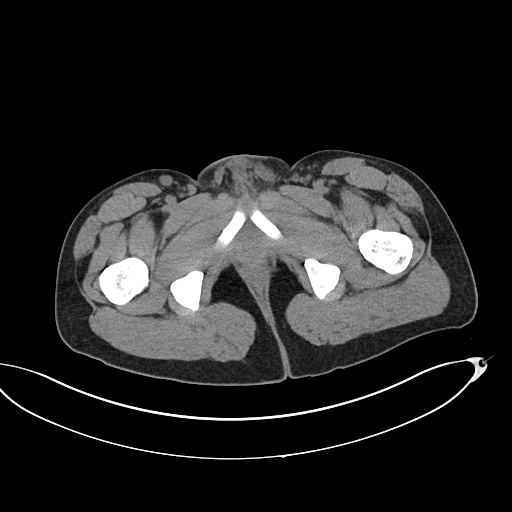
[im 31/111  soft-tissue]
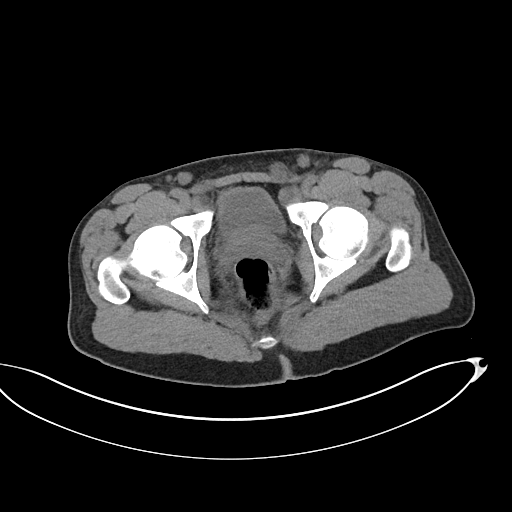
[im 40/111  soft-tissue]
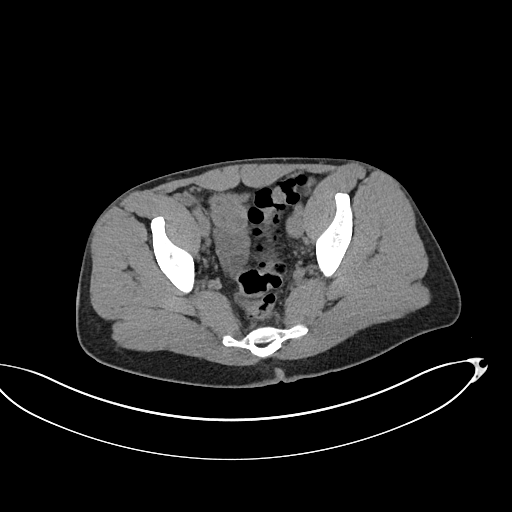
[im 49/111  soft-tissue]
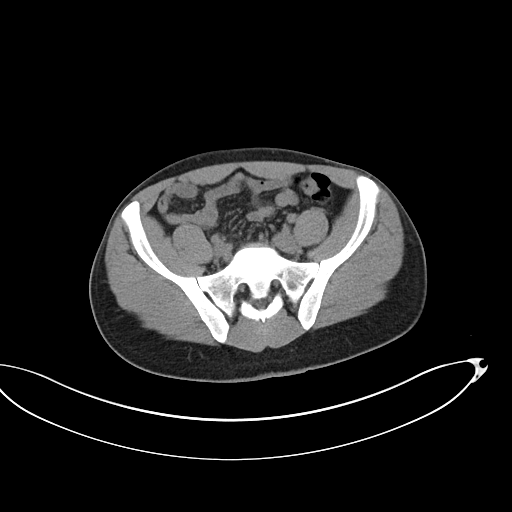
[im 58/111  soft-tissue]
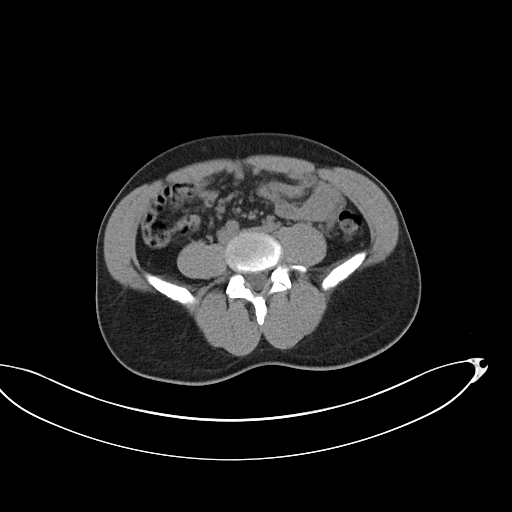
[im 62/111  soft-tissue]
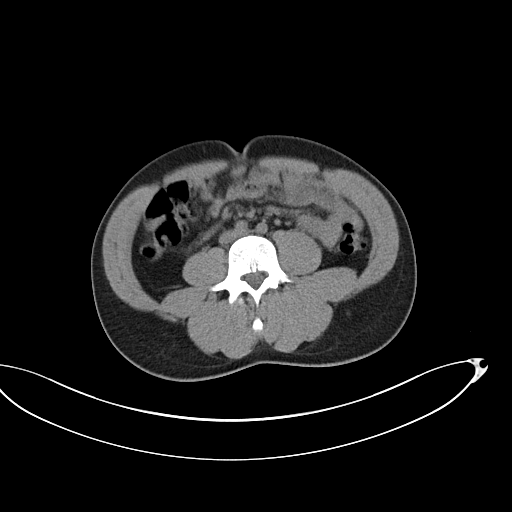
[im 71/111  soft-tissue]
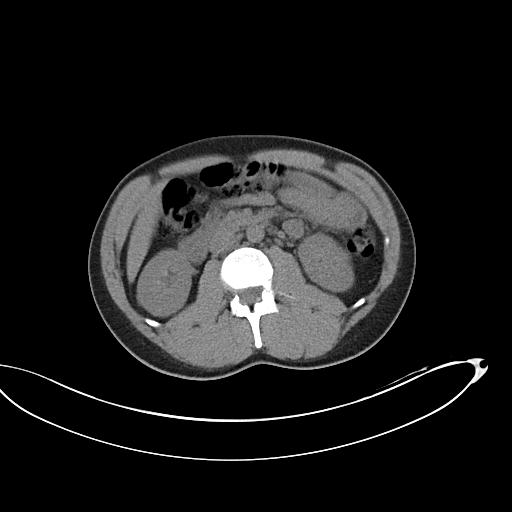
[im 71/111  bone]
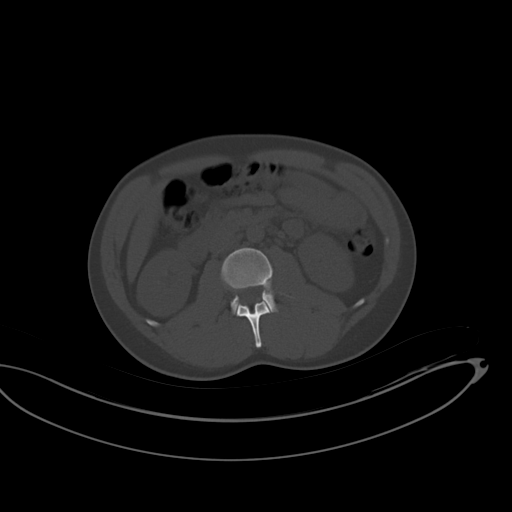
[im 80/111  soft-tissue]
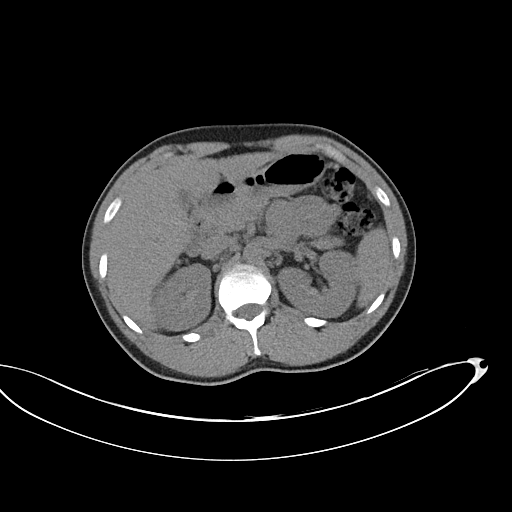
[im 89/111  soft-tissue]
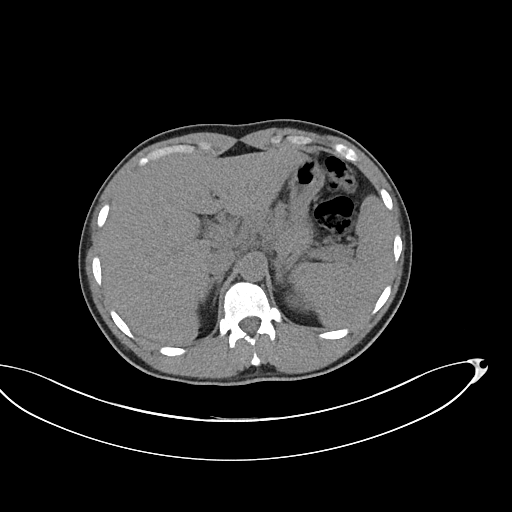
[im 97/111  soft-tissue]
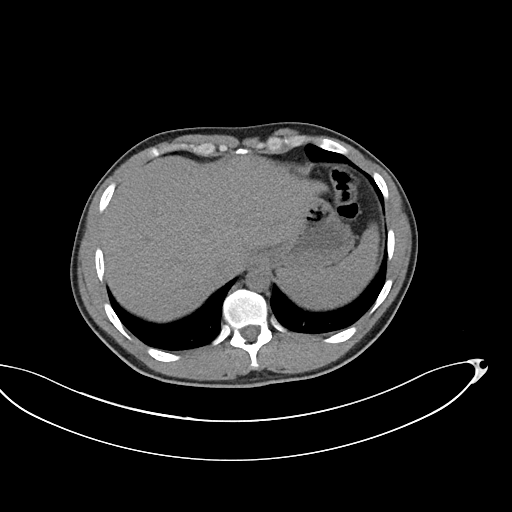
[im 106/111  soft-tissue]
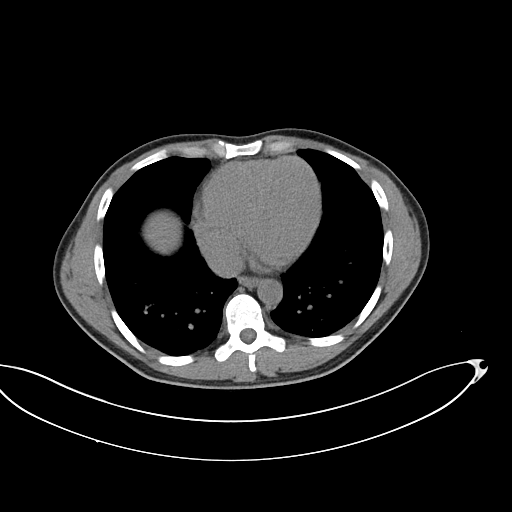

[Series 6: coronal · coronal · 0.95mm/px · 3 of 101 slices shown]
[im 34/101  soft-tissue]
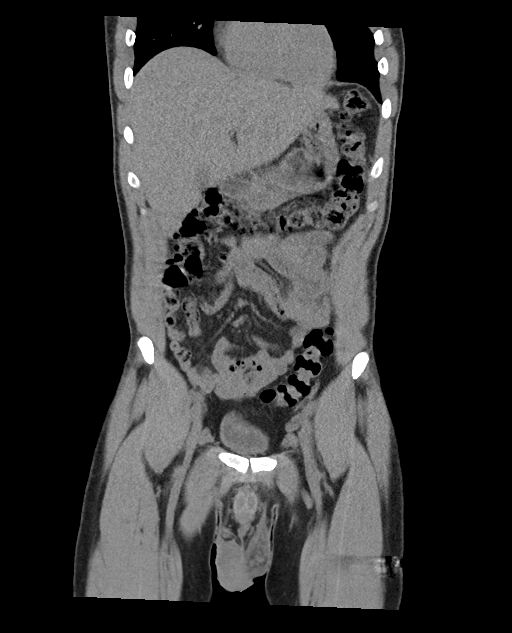
[im 45/101  soft-tissue]
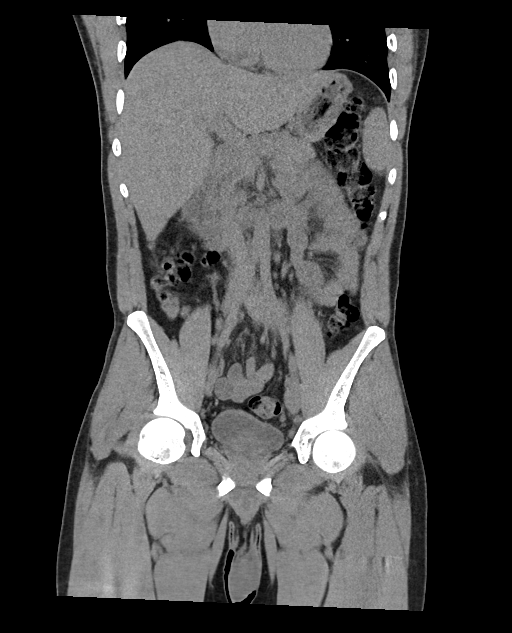
[im 56/101  soft-tissue]
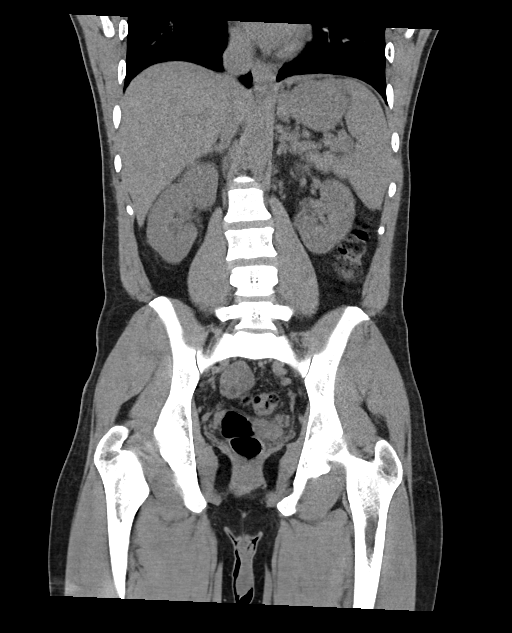

[16 of 46 positions shown; findings below may reference images not displayed]

FINDINGS: Lower chest: Unremarkable.

Hepatobiliary: No suspicious cystic or solid hepatic lesions. No
intra or extrahepatic biliary ductal dilatation. Gallbladder is
nearly decompressed, but otherwise unremarkable in appearance.

Pancreas: No definite pancreatic mass or peripancreatic fluid
collections or inflammatory changes are noted on today's noncontrast
CT examination.

Spleen: Unremarkable.

Adrenals/Urinary Tract: 3-4 mm nonobstructive calculi in the lower
pole collecting system of the right kidney. No additional calculi
are noted within the left kidney, along the course of either ureter,
or within the lumen of the urinary bladder. No
hydroureteronephrosis. Unenhanced appearance of the right kidney and
bilateral adrenal glands is unremarkable. 2.3 cm low-attenuation
lesion in the interpolar region of the left kidney, incompletely
characterized on today's non-contrast CT examination, but
statistically likely to represent a tiny cyst. Urinary bladder is
normal in appearance.

Stomach/Bowel: Unenhanced appearance of the stomach is normal. No
pathologic dilatation of small bowel or colon. Normal appendix.

Vascular/Lymphatic: No atherosclerotic calcifications are noted in
the abdominal aorta or pelvic vasculature. No lymphadenopathy noted
in the abdomen or pelvis.

Reproductive: Prostate gland and seminal vesicles are unremarkable
in appearance.

Other: No significant volume of ascites.  No pneumoperitoneum.

Musculoskeletal: There are no aggressive appearing lytic or blastic
lesions noted in the visualized portions of the skeleton.
IMPRESSION: 1. 3-4 mm nonobstructive calculi in the lower pole collecting system
of the right kidney. No ureteral stones or findings of urinary tract
obstruction are noted at this time.
2. No other acute findings are noted elsewhere in the abdomen or
pelvis to account for the patient's symptoms.
3. Additional incidental findings, as above.

## 2021-10-15 ENCOUNTER — Ambulatory Visit (INDEPENDENT_AMBULATORY_CARE_PROVIDER_SITE_OTHER): Payer: Self-pay | Admitting: Pharmacist

## 2021-10-15 ENCOUNTER — Other Ambulatory Visit: Payer: Self-pay

## 2021-10-15 DIAGNOSIS — B20 Human immunodeficiency virus [HIV] disease: Secondary | ICD-10-CM

## 2021-10-15 DIAGNOSIS — Z202 Contact with and (suspected) exposure to infections with a predominantly sexual mode of transmission: Secondary | ICD-10-CM

## 2021-10-15 MED ORDER — PENICILLIN G BENZATHINE 1200000 UNIT/2ML IM SUSY
2.4000 10*6.[IU] | PREFILLED_SYRINGE | Freq: Once | INTRAMUSCULAR | Status: AC
  Administered 2021-10-15: 2.4 10*6.[IU] via INTRAMUSCULAR

## 2021-10-15 NOTE — Progress Notes (Signed)
   HPI: Sean Downs is a 40 y.o. male who presents to the [facility] pharmacy clinic for syphilis exposure.  There are no problems to display for this patient.   Patient's Medications  New Prescriptions   No medications on file  Previous Medications   CEPHALEXIN (KEFLEX) 500 MG CAPSULE    Take 1 capsule (500 mg total) by mouth 3 (three) times daily.   IBUPROFEN (ADVIL) 400 MG TABLET    Take 1 tablet (400 mg total) by mouth every 6 (six) hours as needed for moderate pain.  Modified Medications   No medications on file  Discontinued Medications   No medications on file    Allergies: No Known Allergies  Past Medical History: Past Medical History:  Diagnosis Date   Kidney stone     Social History: Social History   Socioeconomic History   Marital status: Single    Spouse name: Not on file   Number of children: Not on file   Years of education: Not on file   Highest education level: Not on file  Occupational History   Not on file  Tobacco Use   Smoking status: Every Day   Smokeless tobacco: Never  Substance and Sexual Activity   Alcohol use: No   Drug use: Yes    Types: Marijuana   Sexual activity: Not on file  Other Topics Concern   Not on file  Social History Narrative   Not on file   Social Determinants of Health   Financial Resource Strain: Not on file  Food Insecurity: Not on file  Transportation Needs: Not on file  Physical Activity: Not on file  Stress: Not on file  Social Connections: Not on file    Labs: No results found for: HIV1RNAQUANT, HIV1RNAVL, CD4TABS  RPR and STI No results found for: LABRPR, RPRTITER  No flowsheet data found.  Hepatitis B No results found for: HEPBSAB, HEPBSAG, HEPBCAB Hepatitis C No results found for: HEPCAB, HCVRNAPCRQN Hepatitis A No results found for: HAV Lipids: No results found for: CHOL, TRIG, HDL, CHOLHDL, VLDL, LDLCALC   Assessment: Sean Downs presents to clinic today with his partner Sean Downs who sees  Dr. Daiva Eves here for HIV care. 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 They state they have been sexually active since Sean Downs was diagnosed. Explained that they should abstain from any sexual relation until 10 days following the completion of treatment. They deferred condoms today. Administered Bicillin. Check RPR today. Treatment duration will depend on RPR results. He states he has been treated for syphilis before but is unsure of RPR results. Will call [facility] for results if available. Scheduled him regardless for now with his partner to be ready if needs further treatment today.   Of note, Sean Downs is HIV-negative and has never been on PrEP therapy before. 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 Will follow up about this at his next visit.  Plan: Administer Bicillin Check RPR Follow-up with me next Friday to discuss PreP / receive further Bicillin  Sean Downs, PharmD, CPP Clinical Pharmacist Practitioner Infectious Diseases Clinical Pharmacist [facility] 10/15/2021, 2:07 PM

## 2021-10-19 ENCOUNTER — Telehealth: Payer: Self-pay

## 2021-10-19 LAB — RPR TITER: RPR Titer: 1:32 {titer} — ABNORMAL HIGH

## 2021-10-19 LAB — RPR: RPR Ser Ql: REACTIVE — AB

## 2021-10-19 LAB — FLUORESCENT TREPONEMAL AB(FTA)-IGG-BLD: Fluorescent Treponemal ABS: REACTIVE — AB

## 2021-10-19 NOTE — Telephone Encounter (Signed)
Patient to receive 2.4 million units Bicillin IM weekly x 3 per Margarite Gouge, RPH-CPP. First dose given 10/15/21, next doses due 10/28 and 11/3.  Sandie Ano, RN

## 2021-10-19 NOTE — Telephone Encounter (Signed)
Yes thank you! Sean Downs

## 2021-10-22 ENCOUNTER — Other Ambulatory Visit: Payer: Self-pay

## 2021-10-22 ENCOUNTER — Ambulatory Visit (INDEPENDENT_AMBULATORY_CARE_PROVIDER_SITE_OTHER): Payer: Self-pay

## 2021-10-22 DIAGNOSIS — A539 Syphilis, unspecified: Secondary | ICD-10-CM

## 2021-10-22 MED ORDER — PENICILLIN G BENZATHINE 1200000 UNIT/2ML IM SUSY
1.2000 10*6.[IU] | PREFILLED_SYRINGE | Freq: Once | INTRAMUSCULAR | Status: AC
  Administered 2021-10-22: 1.2 10*6.[IU] via INTRAMUSCULAR

## 2021-10-22 NOTE — Progress Notes (Signed)
Reviewed and verified allergies with patient. Patient tolerated Bicillin injections well. Reinforced abstinence until treatment completed plus an additional 10 days, offered condoms and encouraged use. Advised patient to notify sexual partners for testing and treatment. Patient verbalized understanding.   Eleni Frank D Kesean Serviss, RN  

## 2021-10-28 ENCOUNTER — Ambulatory Visit (INDEPENDENT_AMBULATORY_CARE_PROVIDER_SITE_OTHER): Payer: Self-pay | Admitting: Pharmacist

## 2021-10-28 ENCOUNTER — Other Ambulatory Visit: Payer: Self-pay

## 2021-10-28 DIAGNOSIS — A539 Syphilis, unspecified: Secondary | ICD-10-CM

## 2021-10-28 DIAGNOSIS — B20 Human immunodeficiency virus [HIV] disease: Secondary | ICD-10-CM

## 2021-10-28 DIAGNOSIS — Z79899 Other long term (current) drug therapy: Secondary | ICD-10-CM

## 2021-10-28 MED ORDER — PENICILLIN G BENZATHINE 1200000 UNIT/2ML IM SUSY
2.4000 10*6.[IU] | PREFILLED_SYRINGE | Freq: Once | INTRAMUSCULAR | Status: AC
  Administered 2021-10-28: 2.4 10*6.[IU] via INTRAMUSCULAR

## 2021-10-28 NOTE — Progress Notes (Signed)
   Date:  10/28/2021   HPI: Sean Downs is a 40 y.o. male who presents to the [facility] pharmacy clinic for HIV PrEP follow-up.  Insured   []    Uninsured  [x]    There are no problems to display for this patient.   Patient's Medications  New Prescriptions   No medications on file  Previous Medications   CEPHALEXIN (KEFLEX) 500 MG CAPSULE    Take 1 capsule (500 mg total) by mouth 3 (three) times daily.   IBUPROFEN (ADVIL) 400 MG TABLET    Take 1 tablet (400 mg total) by mouth every 6 (six) hours as needed for moderate pain.  Modified Medications   No medications on file  Discontinued Medications   No medications on file    Allergies: No Known Allergies  Past Medical History: Past Medical History:  Diagnosis Date   Kidney stone     Social History: Social History   Socioeconomic History   Marital status: Single    Spouse name: Not on file   Number of children: Not on file   Years of education: Not on file   Highest education level: Not on file  Occupational History   Not on file  Tobacco Use   Smoking status: Every Day   Smokeless tobacco: Never  Substance and Sexual Activity   Alcohol use: No   Drug use: Yes    Types: Marijuana   Sexual activity: Not on file  Other Topics Concern   Not on file  Social History Narrative   Not on file   Social Determinants of Health   Financial Resource Strain: Not on file  Food Insecurity: Not on file  Transportation Needs: Not on file  Physical Activity: Not on file  Stress: Not on file  Social Connections: Not on file    No flowsheet data found.  Labs:  SCr: Lab Results  Component Value Date   CREATININE 1.20 07/02/2020   CREATININE 1.36 (H) 11/07/2016   CREATININE 0.90 03/16/2016   CREATININE 1.05 04/25/2014   HIV No results found for: HIV Hepatitis B No results found for: HEPBSAB, HEPBSAG, HEPBCAB Hepatitis C No results found for: HEPCAB, HCVRNAPCRQN Hepatitis A No results found for: HAV RPR and  STI Lab Results  Component Value Date   LABRPR REACTIVE (A) 10/15/2021   RPRTITER 1:32 (H) 10/15/2021    No flowsheet data found.  Assessment: Sean Downs presents to clinic today for his 3rd Bicillin-LA injection for syphilis and to talk about HIV PrEP options. His partner is HIV positive and follows here for care as well. Unfortunately, he is not eligible for research PrEP study here at [facility]. We discussed Truvada and Descovy vs Apretude. He is more interested in starting with an oral medication. We will start Descovy for him if his HIV Antibody is negative today. We will follow-up in 3 months. He does not have any questions or concerns today.  Administered 3 of 3 Bicillin LA 2.4 Million Units IM x1 dose today. Monitored patient for 15 minutes post-injections. Counseled to take Tylenol or NSAIDs if needed. No issues or concerns noted.    Plan: Administer Bicillin-LA 2.4 Million Units IM Descovy x3 months if HIV negative Follow up on 01/31/22 @ 11:30 am with Cassie for PrEP  Follow up on 04/27/22 @ 11:30 for RPR testing and PrEP   03/31/22, PharmD PGY2 Infectious Diseases Pharmacy Resident

## 2021-10-29 ENCOUNTER — Telehealth: Payer: Self-pay

## 2021-10-29 NOTE — Telephone Encounter (Signed)
  Patient approved 10/29/21- 10/29/22 for Descoy at no charge.  Shirlee More, PharmD PGY2 Infectious Diseases Pharmacy Resident

## 2021-11-01 ENCOUNTER — Telehealth: Payer: Self-pay

## 2021-11-01 LAB — HIV-1/2 AB - DIFFERENTIATION
HIV-1 antibody: POSITIVE — AB
HIV-2 Ab: NEGATIVE

## 2021-11-01 LAB — HIV ANTIBODY (ROUTINE TESTING W REFLEX): HIV 1&2 Ab, 4th Generation: REACTIVE — AB

## 2021-11-01 NOTE — Telephone Encounter (Signed)
I called Sean Downs to discuss results from his visit last week. He was interested in beginning PrEP for HIV. His HIV test resulted positive. He acknowledged that he was available to discuss at the time of the call. We discussed the next steps following a positive test and made an appointment for him with Daiva Eves in 11/9 @ 2:45. He wants to seek care as soon as possible. I assured him that we will see him and get baseline labs and get him started on therapy right away. He does not have any questions or concerns at this moment. I told him to give [place] a call with any questions or concerns that come up.   Shirlee More, PharmD PGY2 Infectious Diseases Pharmacy Resident

## 2021-11-03 ENCOUNTER — Ambulatory Visit (INDEPENDENT_AMBULATORY_CARE_PROVIDER_SITE_OTHER): Payer: Self-pay | Admitting: Infectious Disease

## 2021-11-03 ENCOUNTER — Other Ambulatory Visit: Payer: Self-pay

## 2021-11-03 ENCOUNTER — Ambulatory Visit: Payer: Self-pay

## 2021-11-03 ENCOUNTER — Ambulatory Visit (INDEPENDENT_AMBULATORY_CARE_PROVIDER_SITE_OTHER): Payer: Self-pay | Admitting: Pharmacist

## 2021-11-03 ENCOUNTER — Encounter: Payer: Self-pay | Admitting: Infectious Disease

## 2021-11-03 DIAGNOSIS — B20 Human immunodeficiency virus [HIV] disease: Secondary | ICD-10-CM

## 2021-11-03 DIAGNOSIS — A539 Syphilis, unspecified: Secondary | ICD-10-CM

## 2021-11-03 HISTORY — DX: Syphilis, unspecified: A53.9

## 2021-11-03 HISTORY — DX: Human immunodeficiency virus (HIV) disease: B20

## 2021-11-03 MED ORDER — DOVATO 50-300 MG PO TABS: 1.0000 | ORAL_TABLET | Freq: Every day | ORAL | 11 refills | Status: DC

## 2021-11-03 NOTE — Progress Notes (Signed)
Subjective:  Chief complaint: establish care for newly diagnosed HIV   Patient ID: Sean Downs, male    DOB: 1981-05-29, 40 y.o.   MRN: [id number]  HPI   40 year old Black man newly diagnosed with  HIV with a history of kidney stones. He was seen by [facility] and [facility] for STI treatment with 3rd bicillin shot for syphilis with his partner who is HIV + but perfectly controlled now on Cabenuva.   Patient had HIV test, 4th generation which was positive.   H e is here to establish care and same-day start antiretrovirals.  Past Medical History:  Diagnosis Date   Kidney stone     No past surgical history on file.  No family history on file.    Social History   Socioeconomic History   Marital status: Single    Spouse name: Not on file   Number of children: Not on file   Years of education: Not on file   Highest education level: Not on file  Occupational History   Not on file  Tobacco Use   Smoking status: Every Day   Smokeless tobacco: Never  Substance and Sexual Activity   Alcohol use: No   Drug use: Yes    Types: Marijuana   Sexual activity: Not on file  Other Topics Concern   Not on file  Social History Narrative   Not on file   Social Determinants of Health   Financial Resource Strain: Not on file  Food Insecurity: Not on file  Transportation Needs: Not on file  Physical Activity: Not on file  Stress: Not on file  Social Connections: Not on file    No Known Allergies  No current outpatient medications on file.   Review of Systems  Constitutional:  Negative for activity change, appetite change, chills, diaphoresis, fatigue, fever and unexpected weight change.  HENT:  Negative for congestion, rhinorrhea, sinus pressure, sneezing, sore throat and trouble swallowing.   Eyes:  Negative for photophobia and visual disturbance.  Respiratory:  Negative for cough, chest tightness, shortness of breath, wheezing and stridor.    Cardiovascular:  Negative for chest pain, palpitations and leg swelling.  Gastrointestinal:  Negative for abdominal distention, abdominal pain, anal bleeding, blood in stool, constipation, diarrhea, nausea and vomiting.  Genitourinary:  Negative for difficulty urinating, dysuria, flank pain and hematuria.  Musculoskeletal:  Negative for arthralgias, back pain, gait problem, joint swelling and myalgias.  Skin:  Negative for color change, pallor, rash and wound.  Neurological:  Negative for dizziness, tremors, weakness and light-headedness.  Hematological:  Negative for adenopathy. Does not bruise/bleed easily.  Psychiatric/Behavioral:  Negative for agitation, behavioral problems, confusion, decreased concentration, dysphoric mood and sleep disturbance.       Objective:   Physical Exam Constitutional:      Appearance: He is well-developed.  HENT:     Head: Normocephalic and atraumatic.  Eyes:     Conjunctiva/sclera: Conjunctivae normal.  Cardiovascular:     Rate and Rhythm: Normal rate and regular rhythm.  Pulmonary:     Effort: Pulmonary effort is normal. No respiratory distress.     Breath sounds: No wheezing.  Abdominal:     General: There is no distension.     Palpations: Abdomen is soft.  Musculoskeletal:        General: No tenderness. Normal range of motion.     Cervical back: Normal range of motion and neck supple.  Skin:    General: Skin  is warm and dry.     Coloration: Skin is not pale.     Findings: No erythema or rash.  Neurological:     General: No focal deficit present.     Mental Status: He is alert and oriented to person, place, and time.  Psychiatric:        Mood and Affect: Mood normal.        Behavior: Behavior normal.        Thought Content: Thought content normal.        Judgment: Judgment normal.          Assessment & Plan:   HIV infection: newly diagnosed  Will check HIV viral note load with reflex to resistance testing hepatitis virus panel,  CMP CBC with differential GC and chlamydia from urine oropharynx rectum.  I am starting him on Dovato and he had his first dose today in clinic.  He is enrolling into the H MAP program.  Along with [employer].  Syphilis: Has received 3 shots of Bicillin.  I spent 67  minutes with the patient including than 50% of the time in face to face counseling of the patient during the nature of HIV infection how we monitor response to treatment undetectable equals on transmissible we will screen for other STIs need for high levels of adherence to antiretroviral therapy different oral options for same day starts,along with  reive  of 4th generation HIV test, and review of medical records in preparation for the visit and during the visit and in coordination of his care.

## 2021-11-03 NOTE — Progress Notes (Signed)
Sean Downs presents to clinic to begin HIV treatment. He is going to be starting on Dovato.  Explained that Dovato is a one pill once daily medication with or without food and the importance of not missing any doses. Explained resistance and how it develops and why it is so important to take Dovato daily and not skip days or doses. Counseled patient to take it around the same time each day. Counseled on what to do if dose is missed, if closer to missed dose take immediately, if closer to next dose then skip and resume normal schedule. Cautioned on possible side effects the first week or so including nausea, diarrhea, dizziness, and headaches but that they should resolve after the first couple of weeks. I reviewed patient medications and found no drug interactions. Counseled patient to separate Dovato from divalent cations including multivitamins. Discussed with patient to call clinic if he starts a new medication or herbal supplement. I gave the patient my card and told him to call me with any issues/questions/concerns.    Shirlee More, PharmD PGY2 Infectious Diseases Pharmacy Resident

## 2021-11-04 ENCOUNTER — Other Ambulatory Visit (HOSPITAL_COMMUNITY)
Admission: RE | Admit: 2021-11-04 | Discharge: 2021-11-04 | Disposition: A | Discharge status: Home / Self Care | Payer: Self-pay | Source: Ambulatory Visit | Attending: Infectious Disease | Admitting: Infectious Disease

## 2021-11-04 ENCOUNTER — Encounter: Payer: Self-pay | Admitting: Infectious Disease

## 2021-11-04 DIAGNOSIS — B20 Human immunodeficiency virus [HIV] disease: Secondary | ICD-10-CM | POA: Insufficient documentation

## 2021-11-04 LAB — T-HELPER CELL (CD4) - (RCID CLINIC ONLY)
CD4 % Helper T Cell: 42 % (ref 33–65)
CD4 T Cell Abs: 388 /uL — ABNORMAL LOW (ref 400–1790)

## 2021-11-04 NOTE — Addendum Note (Signed)
Addended by: Harley Alto on: 11/04/2021 10:42 AM   Modules accepted: Orders

## 2021-11-05 ENCOUNTER — Other Ambulatory Visit: Payer: Self-pay | Admitting: Pharmacist

## 2021-11-05 DIAGNOSIS — B20 Human immunodeficiency virus [HIV] disease: Secondary | ICD-10-CM

## 2021-11-05 LAB — URINE CYTOLOGY ANCILLARY ONLY
Chlamydia: NEGATIVE
Comment: NEGATIVE
Comment: NORMAL
Neisseria Gonorrhea: NEGATIVE

## 2021-11-05 LAB — CYTOLOGY, (ORAL, ANAL, URETHRAL) ANCILLARY ONLY
Chlamydia: NEGATIVE
Comment: NEGATIVE
Comment: NORMAL
Neisseria Gonorrhea: NEGATIVE

## 2021-11-05 MED ORDER — DOVATO 50-300 MG PO TABS: 1.0000 | ORAL_TABLET | Freq: Every day | ORAL | 0 refills | Status: AC

## 2021-11-05 NOTE — Progress Notes (Signed)
Medication Samples have been provided to the patient.  Drug name: Dovato        Strength: 50/300 mg         Qty: 42  Tablets (3 bottles) LOT: 2A6H   Exp.Date: 2/24  Dosing instructions: Take one tablet by mouth once daily  The patient has been instructed regarding the correct time, dose, and frequency of taking this medication, including desired effects and most common side effects.   Lilliauna Van, PharmD, CPP Clinical Pharmacist Practitioner Infectious Diseases Clinical Pharmacist [facility]  

## 2021-11-11 LAB — COMPLETE METABOLIC PANEL WITH GFR
AG Ratio: 0.8 (calc) — ABNORMAL LOW (ref 1.0–2.5)
ALT: 40 U/L (ref 9–46)
AST: 81 U/L — ABNORMAL HIGH (ref 10–40)
Albumin: 3.7 g/dL (ref 3.6–5.1)
Alkaline phosphatase (APISO): 96 U/L (ref 36–130)
BUN: 14 mg/dL (ref 7–25)
CO2: 25 mmol/L (ref 20–32)
Calcium: 9 mg/dL (ref 8.6–10.3)
Chloride: 106 mmol/L (ref 98–110)
Creat: 0.96 mg/dL (ref 0.60–1.26)
Globulin: 4.4 g/dL (calc) — ABNORMAL HIGH (ref 1.9–3.7)
Glucose, Bld: 78 mg/dL (ref 65–99)
Potassium: 4.2 mmol/L (ref 3.5–5.3)
Sodium: 139 mmol/L (ref 135–146)
Total Bilirubin: 0.3 mg/dL (ref 0.2–1.2)
Total Protein: 8.1 g/dL (ref 6.1–8.1)
eGFR: 104 mL/min/{1.73_m2} (ref >=60)

## 2021-11-11 LAB — CBC WITH DIFFERENTIAL/PLATELET
Absolute Monocytes: 355 cells/uL (ref 200–950)
Basophils Absolute: 30 cells/uL (ref 0–200)
Basophils Relative: 0.8 %
Eosinophils Absolute: 296 cells/uL (ref 15–500)
Eosinophils Relative: 8 %
HCT: 39.6 % (ref 38.5–50.0)
Hemoglobin: 13.3 g/dL (ref 13.2–17.1)
Lymphs Abs: 881 cells/uL (ref 850–3900)
MCH: 29.1 pg (ref 27.0–33.0)
MCHC: 33.6 g/dL (ref 32.0–36.0)
MCV: 86.7 fL (ref 80.0–100.0)
MPV: 10.4 fL (ref 7.5–12.5)
Monocytes Relative: 9.6 %
Neutro Abs: 2139 cells/uL (ref 1500–7800)
Neutrophils Relative %: 57.8 %
Platelets: 284 10*3/uL (ref 140–400)
RBC: 4.57 10*6/uL (ref 4.20–5.80)
RDW: 13.1 % (ref 11.0–15.0)
Total Lymphocyte: 23.8 %
WBC: 3.7 10*3/uL — ABNORMAL LOW (ref 3.8–10.8)

## 2021-11-11 LAB — HEPATITIS B SURFACE ANTIGEN: Hepatitis B Surface Ag: NONREACTIVE

## 2021-11-11 LAB — HIV-1 INTEGRASE GENOTYPE

## 2021-11-11 LAB — QUANTIFERON-TB GOLD PLUS
Mitogen-NIL: >10 IU/mL
NIL: 0.05 IU/mL
QuantiFERON-TB Gold Plus: NEGATIVE
TB1-NIL: 0 IU/mL
TB2-NIL: 0.01 IU/mL

## 2021-11-11 LAB — LIPID PANEL
Cholesterol: 147 mg/dL (ref <200)
HDL: 39 mg/dL — ABNORMAL LOW (ref >=40)
LDL Cholesterol (Calc): 89 mg/dL (calc)
Non-HDL Cholesterol (Calc): 108 mg/dL (calc) (ref <130)
Total CHOL/HDL Ratio: 3.8 (calc) (ref <5.0)
Triglycerides: 93 mg/dL (ref <150)

## 2021-11-11 LAB — HEPATITIS A ANTIBODY, TOTAL: Hepatitis A AB,Total: NONREACTIVE

## 2021-11-11 LAB — HIV-1 GENOTYPE: HIV-1 Genotype: DETECTED — AB

## 2021-11-11 LAB — HIV RNA, RTPCR W/R GT (RTI, PI,INT)
HIV 1 RNA Quant: 10900 copies/mL — ABNORMAL HIGH
HIV-1 RNA Quant, Log: 4.04 Log copies/mL — ABNORMAL HIGH

## 2021-11-11 LAB — HLA B*5701: HLA-B*5701 w/rflx HLA-B High: NEGATIVE

## 2021-11-11 LAB — HEPATITIS C ANTIBODY
Hepatitis C Ab: NONREACTIVE
SIGNAL TO CUT-OFF: 0.2 (ref <1.00)

## 2021-11-11 LAB — HEPATITIS B SURFACE ANTIBODY, QUANTITATIVE: Hep B S AB Quant (Post): 190 m[IU]/mL (ref >=10)

## 2021-12-16 ENCOUNTER — Ambulatory Visit (INDEPENDENT_AMBULATORY_CARE_PROVIDER_SITE_OTHER): Payer: Self-pay | Admitting: Pharmacist

## 2021-12-16 ENCOUNTER — Other Ambulatory Visit: Payer: Self-pay | Admitting: Pharmacist

## 2021-12-16 ENCOUNTER — Ambulatory Visit: Payer: Self-pay

## 2021-12-16 ENCOUNTER — Other Ambulatory Visit: Payer: Self-pay

## 2021-12-16 DIAGNOSIS — B20 Human immunodeficiency virus [HIV] disease: Secondary | ICD-10-CM

## 2021-12-16 MED ORDER — DOVATO 50-300 MG PO TABS: 1.0000 | ORAL_TABLET | Freq: Every day | ORAL | 0 refills | Status: AC

## 2021-12-16 NOTE — Progress Notes (Signed)
12/16/2021  HPI: Sean Downs is a 40 y.o. male who presents to the [facility] pharmacy clinic for HIV follow-up.  Patient Active Problem List   Diagnosis Date Noted   HIV disease (HCC) 11/03/2021   Syphilis 11/03/2021    Patient's Medications  New Prescriptions   No medications on file  Previous Medications   DOLUTEGRAVIR-LAMIVUDINE (DOVATO) 50-300 MG TABLET    Take 1 tablet by mouth daily.  Modified Medications   No medications on file  Discontinued Medications   No medications on file    Allergies: No Known Allergies  Past Medical History: Past Medical History:  Diagnosis Date   HIV disease (HCC) 11/03/2021   Kidney stone    Syphilis 11/03/2021    Social History: Social History   Socioeconomic History   Marital status: Single    Spouse name: Not on file   Number of children: Not on file   Years of education: Not on file   Highest education level: Not on file  Occupational History   Not on file  Tobacco Use   Smoking status: Every Day    Packs/day: 0.25    Years: 3.00    Pack years: 0.75    Types: Cigarettes   Smokeless tobacco: Never   Tobacco comments:    1-800 -quit now discussed.  Substance and Sexual Activity   Alcohol use: No   Drug use: Yes    Types: Marijuana    Comment: cocaine in past   Sexual activity: Yes    Comment: 1 partner  Other Topics Concern   Not on file  Social History Narrative   Patient lives with partner in an apartment. Patient is working at [employer].    Social Determinants of Health   Financial Resource Strain: Not on file  Food Insecurity: Not on file  Transportation Needs: Not on file  Physical Activity: Not on file  Stress: Not on file  Social Connections: Not on file    Labs: Lab Results  Component Value Date   HIV1RNAQUANT 10,900 (H) 11/03/2021   CD4TABS 388 (L) 11/03/2021    RPR and STI Lab Results  Component Value Date   LABRPR REACTIVE (A) 10/15/2021   RPRTITER 1:32 (H) 10/15/2021    STI  Results GC CT  11/04/2021 Negative Negative  11/03/2021 Negative Negative  11/03/2021 Other Other    Hepatitis B Lab Results  Component Value Date   HEPBSAG NON-REACTIVE 11/03/2021   Hepatitis C Lab Results  Component Value Date   HEPCAB NON-REACTIVE 11/03/2021   Hepatitis A Lab Results  Component Value Date   HAV NON-REACTIVE 11/03/2021   Lipids: Lab Results  Component Value Date   CHOL 147 11/03/2021   TRIG 93 11/03/2021   HDL 39 (L) 11/03/2021   CHOLHDL 3.8 11/03/2021   LDLCALC 89 11/03/2021    Current HIV Regimen: Dovato  Assessment: Sean Downs is here today to follow up after being diagnosed with HIV back in November. He was started on Dovato via rapid initiation. He has no insurance and is supposed to bring his paystubs in for our financial assistance paperwork Sean Downs) but has not yet. I urged him to get that done so that he can get his medications for free. I will give him another month of samples as he took his last dose yesterday. He knows to reach back out to me if he needs more but asked that he please bring in the necessary paperwork for HMAP.  No issues with tolerating Dovato or  side effects. He states that he has not missed any doses. He takes it every day between 8-10 am. No new medications since starting. Will check a HIV RNA today and have him see Dr. Daiva Eves in 6-8 weeks.  Medication Samples have been provided to the patient.  Drug name: Dovato        Strength: 50/300 mg         Qty: 28 tablets (2 bottles)   LOT: 2A6H   Exp.Date: 01/26/2023  Dosing instructions: Take one tablet by mouth once daily  The patient has been instructed regarding the correct time, dose, and frequency of taking this medication, including desired effects and most common side effects.   Plan: - Dovato samples provided - HIV RNA today - F/up with Dr. Daiva Eves in 6-8 weeks  Sean Downs, PharmD, BCIDP, AAHIVP, CPP Clinical Pharmacist  Practitioner Infectious Diseases Clinical Pharmacist [facility] 12/16/2021, 10:21 AM

## 2021-12-19 LAB — HIV-1 RNA QUANT-NO REFLEX-BLD
HIV 1 RNA Quant: <20 Copies/mL — ABNORMAL HIGH
HIV-1 RNA Quant, Log: <1.3 Log cps/mL — ABNORMAL HIGH

## 2022-01-14 ENCOUNTER — Encounter: Payer: Self-pay | Admitting: Infectious Disease

## 2022-01-14 ENCOUNTER — Telehealth: Payer: Self-pay

## 2022-01-14 NOTE — Telephone Encounter (Signed)
Spoke to [employer] is ready for pick up with no co-pay. Patient aware.   Hermon Zea Lesli Albee, CMA

## 2022-01-31 ENCOUNTER — Ambulatory Visit: Payer: Self-pay | Admitting: Pharmacist

## 2022-02-24 ENCOUNTER — Ambulatory Visit (INDEPENDENT_AMBULATORY_CARE_PROVIDER_SITE_OTHER): Payer: Self-pay | Admitting: Infectious Disease

## 2022-02-24 ENCOUNTER — Encounter: Payer: Self-pay | Admitting: Infectious Disease

## 2022-02-24 ENCOUNTER — Other Ambulatory Visit: Payer: Self-pay

## 2022-02-24 ENCOUNTER — Ambulatory Visit: Payer: Self-pay | Admitting: Infectious Disease

## 2022-02-24 VITALS — BP 117/79 | HR 101 | Temp 98.4°F | Wt 180.0 lb

## 2022-02-24 DIAGNOSIS — B20 Human immunodeficiency virus [HIV] disease: Secondary | ICD-10-CM

## 2022-02-24 DIAGNOSIS — A539 Syphilis, unspecified: Secondary | ICD-10-CM

## 2022-02-24 DIAGNOSIS — Z23 Encounter for immunization: Secondary | ICD-10-CM

## 2022-02-24 MED ORDER — DOVATO 50-300 MG PO TABS: 1.0000 | ORAL_TABLET | Freq: Every day | ORAL | 11 refills | Status: DC

## 2022-02-24 NOTE — Progress Notes (Signed)
? ?Subjective:  ?Chief complaint: follwowup HIV disease on medications ? Patient ID: Sean Downs, male    DOB: [birth year], 41 y.o.   MRN: [id number] ? ?HPI ? ? ?41 year old 89 man newly diagnosed with  HIV with a history of kidney stones. He was seen by Raquel [facility] and Alfonse Spruce with [facility] for STI treatment with 3rd bicillin shot for syphilis with his partner who is HIV + but perfectly controlled now on Moriarty.  ? ?Patient had HIV test, 4th generation which was positive.  ? ?He came to establish care and we initiated rapid same-day treatment with Dovato.  His viral load is come down to 11,000 to less than 20. ? ?He is tolerating Dovato without any problem he has put on weight going from 150s up to 180s but he is happy with the weight gain. ? ? ? ?Past Medical History:  ?Diagnosis Date  ? HIV disease ([place]) 11/03/2021  ? Kidney stone   ? Syphilis 11/03/2021  ? ? ?No past surgical history on file. ? ?Family History  ?Problem Relation Age of Onset  ? Cancer Sister   ? Depression Brother   ? Seizures Brother   ? ? ?  ?Social History  ? ?Socioeconomic History  ? Marital status: Single  ?  Spouse name: Not on file  ? Number of children: Not on file  ? Years of education: Not on file  ? Highest education level: Not on file  ?Occupational History  ? Not on file  ?Tobacco Use  ? Smoking status: Former  ?  Types: Cigarettes  ? Smokeless tobacco: Never  ? Tobacco comments:  ?  1-800 -quit now discussed.  ?Substance and Sexual Activity  ? Alcohol use: No  ? Drug use: Yes  ?  Types: Marijuana  ?  Comment: cocaine in past  ? Sexual activity: Yes  ?  Comment: 1 partner  ?Other Topics Concern  ? Not on file  ?Social History Narrative  ? Patient lives with partner in an apartment. Patient is working at [employer].   ? ?Social Determinants of Health  ? ?Financial Resource Strain: Not on file  ?Food Insecurity: Not on file  ?Transportation Needs: Not on file  ?Physical Activity: Not on file  ?Stress: Not on file   ?Social Connections: Not on file  ? ? ?No Known Allergies ? ? ?Current Outpatient Medications:  ?  dolutegravir-lamiVUDine (DOVATO) 50-300 MG tablet, Take 1 tablet by mouth daily., Disp: 30 tablet, Rfl: 11 ? ? ?Review of Systems  ?Constitutional:  Positive for unexpected weight change. Negative for activity change, appetite change, chills, diaphoresis, fatigue and fever.  ?HENT:  Negative for congestion, rhinorrhea, sinus pressure, sneezing, sore throat and trouble swallowing.   ?Eyes:  Negative for photophobia and visual disturbance.  ?Respiratory:  Negative for cough, chest tightness, shortness of breath, wheezing and stridor.   ?Cardiovascular:  Negative for chest pain, palpitations and leg swelling.  ?Gastrointestinal:  Negative for abdominal distention, abdominal pain, anal bleeding, blood in stool, constipation, diarrhea, nausea and vomiting.  ?Genitourinary:  Negative for difficulty urinating, dysuria, flank pain and hematuria.  ?Musculoskeletal:  Negative for arthralgias, back pain, gait problem, joint swelling and myalgias.  ?Skin:  Negative for color change, pallor, rash and wound.  ?Neurological:  Negative for dizziness, tremors, weakness and light-headedness.  ?Hematological:  Negative for adenopathy. Does not bruise/bleed easily.  ?Psychiatric/Behavioral:  Negative for agitation, behavioral problems, confusion, decreased concentration, dysphoric mood and sleep disturbance.   ? ?   ?  Objective:  ? Physical Exam ?Constitutional:   ?   General: He is not in acute distress. ?   Appearance: Normal appearance. He is well-developed. He is not ill-appearing or diaphoretic.  ?HENT:  ?   Head: Normocephalic and atraumatic.  ?   Right Ear: Hearing and external ear normal.  ?   Left Ear: Hearing and external ear normal.  ?   Nose: No nasal deformity or rhinorrhea.  ?Eyes:  ?   General: No scleral icterus. ?   Conjunctiva/sclera: Conjunctivae normal.  ?   Right eye: Right conjunctiva is not injected.  ?   Left eye:  Left conjunctiva is not injected.  ?   Pupils: Pupils are equal, round, and reactive to light.  ?Neck:  ?   Vascular: No JVD.  ?Cardiovascular:  ?   Rate and Rhythm: Normal rate and regular rhythm.  ?   Heart sounds: Normal heart sounds, S1 normal and S2 normal. No murmur heard. ?  No friction rub.  ?Pulmonary:  ?   Effort: Pulmonary effort is normal. No respiratory distress.  ?   Breath sounds: No wheezing.  ?Abdominal:  ?   General: Bowel sounds are normal. There is no distension.  ?   Palpations: Abdomen is soft.  ?   Tenderness: There is no abdominal tenderness.  ?Musculoskeletal:     ?   General: No tenderness. Normal range of motion.  ?   Right shoulder: Normal.  ?   Left shoulder: Normal.  ?   Cervical back: Normal range of motion and neck supple.  ?   Right hip: Normal.  ?   Left hip: Normal.  ?   Right knee: Normal.  ?   Left knee: Normal.  ?Lymphadenopathy:  ?   Head:  ?   Right side of head: No submandibular, preauricular or posterior auricular adenopathy.  ?   Left side of head: No submandibular, preauricular or posterior auricular adenopathy.  ?   Cervical: No cervical adenopathy.  ?   Right cervical: No superficial or deep cervical adenopathy. ?   Left cervical: No superficial or deep cervical adenopathy.  ?Skin: ?   General: Skin is warm and dry.  ?   Coloration: Skin is not pale.  ?   Findings: No abrasion, bruising, ecchymosis, erythema, lesion or rash.  ?   Nails: There is no clubbing.  ?Neurological:  ?   General: No focal deficit present.  ?   Mental Status: He is alert and oriented to person, place, and time.  ?   Sensory: No sensory deficit.  ?   Coordination: Coordination normal.  ?   Gait: Gait normal.  ?Psychiatric:     ?   Attention and Perception: He is attentive.     ?   Mood and Affect: Mood normal.     ?   Speech: Speech normal.     ?   Behavior: Behavior normal. Behavior is cooperative.     ?   Thought Content: Thought content normal.     ?   Judgment: Judgment normal.  ? ? ? ? ? ?    ?Assessment & Plan:  ? ?HIV disease: ? ?Rechecking viral load CD4 CBC CMP ? ?Continue his Dovato he is renewed his H MAP program. ? ? ?Syphilis: Status post 3 shots of Bicillin. ? ?Follow RPRs over the next 6 months ? ?GI screen screen for gonorrhea chlamydia genital extragenital sites. ? ?Vaccine counseling we gave him a hepatitis A #  1 and Prevnar 20. ? ?He may need meningococcal vaccine, HPV vaccine. He has not had COVID vaccines yet but is agreeable to them ? ? ? ? ?

## 2022-02-28 LAB — RPR: RPR Ser Ql: REACTIVE — AB

## 2022-02-28 LAB — CBC WITH DIFFERENTIAL/PLATELET
Absolute Monocytes: 589 cells/uL (ref 200–950)
Basophils Absolute: 32 cells/uL (ref 0–200)
Basophils Relative: 0.6 %
Eosinophils Absolute: 259 cells/uL (ref 15–500)
Eosinophils Relative: 4.8 %
HCT: 41.9 % (ref 38.5–50.0)
Hemoglobin: 13.9 g/dL (ref 13.2–17.1)
Lymphs Abs: 1172 cells/uL (ref 850–3900)
MCH: 29.5 pg (ref 27.0–33.0)
MCHC: 33.2 g/dL (ref 32.0–36.0)
MCV: 89 fL (ref 80.0–100.0)
MPV: 9.8 fL (ref 7.5–12.5)
Monocytes Relative: 10.9 %
Neutro Abs: 3348 cells/uL (ref 1500–7800)
Neutrophils Relative %: 62 %
Platelets: 410 10*3/uL — ABNORMAL HIGH (ref 140–400)
RBC: 4.71 10*6/uL (ref 4.20–5.80)
RDW: 13.5 % (ref 11.0–15.0)
Total Lymphocyte: 21.7 %
WBC: 5.4 10*3/uL (ref 3.8–10.8)

## 2022-02-28 LAB — HIV-1 RNA QUANT-NO REFLEX-BLD
HIV 1 RNA Quant: NOT DETECTED Copies/mL
HIV-1 RNA Quant, Log: NOT DETECTED Log cps/mL

## 2022-02-28 LAB — CT/NG RNA, TMA RECTAL
Chlamydia Trachomatis RNA: NOT DETECTED
Neisseria Gonorrhoeae RNA: NOT DETECTED

## 2022-02-28 LAB — T-HELPER CELLS (CD4) COUNT (NOT AT ARMC)
Absolute CD4: 543 cells/uL (ref 490–1740)
CD4 T Helper %: 44 % (ref 30–61)
Total lymphocyte count: 1236 cells/uL (ref 850–3900)

## 2022-02-28 LAB — COMPLETE METABOLIC PANEL WITH GFR
AG Ratio: 0.8 (calc) — ABNORMAL LOW (ref 1.0–2.5)
ALT: 20 U/L (ref 9–46)
AST: 53 U/L — ABNORMAL HIGH (ref 10–40)
Albumin: 3.6 g/dL (ref 3.6–5.1)
Alkaline phosphatase (APISO): 66 U/L (ref 36–130)
BUN: 18 mg/dL (ref 7–25)
CO2: 27 mmol/L (ref 20–32)
Calcium: 9.2 mg/dL (ref 8.6–10.3)
Chloride: 107 mmol/L (ref 98–110)
Creat: 1.15 mg/dL (ref 0.60–1.26)
Globulin: 4.6 g/dL (calc) — ABNORMAL HIGH (ref 1.9–3.7)
Glucose, Bld: 89 mg/dL (ref 65–99)
Potassium: 4 mmol/L (ref 3.5–5.3)
Sodium: 140 mmol/L (ref 135–146)
Total Bilirubin: 0.3 mg/dL (ref 0.2–1.2)
Total Protein: 8.2 g/dL — ABNORMAL HIGH (ref 6.1–8.1)
eGFR: 84 mL/min/{1.73_m2} (ref >=60)

## 2022-02-28 LAB — LIPID PANEL
Cholesterol: 147 mg/dL (ref <200)
HDL: 34 mg/dL — ABNORMAL LOW (ref >=40)
LDL Cholesterol (Calc): 91 mg/dL (calc)
Non-HDL Cholesterol (Calc): 113 mg/dL (calc) (ref <130)
Total CHOL/HDL Ratio: 4.3 (calc) (ref <5.0)
Triglycerides: 122 mg/dL (ref <150)

## 2022-02-28 LAB — C. TRACHOMATIS/N. GONORRHOEAE RNA
C. trachomatis RNA, TMA: NOT DETECTED
N. gonorrhoeae RNA, TMA: NOT DETECTED

## 2022-02-28 LAB — RPR TITER: RPR Titer: 1:16 {titer} — ABNORMAL HIGH

## 2022-02-28 LAB — GC/CHLAMYDIA PROBE, AMP (THROAT)
Chlamydia trachomatis RNA: NOT DETECTED
Neisseria gonorrhoeae RNA: NOT DETECTED

## 2022-02-28 LAB — FLUORESCENT TREPONEMAL AB(FTA)-IGG-BLD: Fluorescent Treponemal ABS: REACTIVE — AB

## 2022-04-27 ENCOUNTER — Ambulatory Visit: Payer: Self-pay | Admitting: Pharmacist

## 2022-05-30 ENCOUNTER — Ambulatory Visit: Payer: Self-pay | Admitting: Infectious Disease

## 2022-05-31 ENCOUNTER — Ambulatory Visit: Payer: Self-pay | Admitting: Infectious Disease

## 2022-06-23 ENCOUNTER — Ambulatory Visit: Payer: Self-pay | Admitting: Infectious Disease

## 2022-06-23 ENCOUNTER — Encounter: Payer: Self-pay | Admitting: Infectious Disease

## 2022-06-23 ENCOUNTER — Other Ambulatory Visit: Payer: Self-pay

## 2022-06-23 VITALS — BP 135/91 | HR 98 | Temp 98.3°F | Ht 71.0 in | Wt 183.0 lb

## 2022-06-23 DIAGNOSIS — A539 Syphilis, unspecified: Secondary | ICD-10-CM

## 2022-06-23 DIAGNOSIS — B20 Human immunodeficiency virus [HIV] disease: Secondary | ICD-10-CM

## 2022-06-23 MED ORDER — DOVATO 50-300 MG PO TABS: 1.0000 | ORAL_TABLET | Freq: Every day | ORAL | 11 refills | Status: DC

## 2022-06-23 NOTE — Progress Notes (Signed)
Subjective:  Chief complaint: ffollowup for HIV on Dovato  Patient ID: Sean Downs, male    DOB: 01/30/1981, 41 y.o.   MRN: [id number]  HPI   41 year old Black man newly diagnosed with  HIV with a history of kidney stones. He was seen by [facility] and [facility] for STI treatment with 3rd bicillin shot for syphilis with his partner who is HIV + but perfectly controlled now on Cabenuva.   Patient had HIV test, 4th generation which was positive.   He came to establish care and we initiated rapid same-day treatment with Dovato.  His viral load is come down to 11,000 to less than 20 and then not detected.  He is having no trouble tolerating the Dovato and is in good spirits he did not want any vaccines today he also only wanted testing of the urine for gonorrhea chlamydia not oropharyngeal or rectal swabs.        Past Medical History:  Diagnosis Date   HIV disease (HCC) 11/03/2021   Kidney stone    Syphilis 11/03/2021    No past surgical history on file.  Family History  Problem Relation Age of Onset   Cancer Sister    Depression Brother    Seizures Brother       Social History   Socioeconomic History   Marital status: Single    Spouse name: Not on file   Number of children: Not on file   Years of education: Not on file   Highest education level: Not on file  Occupational History   Not on file  Tobacco Use   Smoking status: Former    Types: Cigarettes   Smokeless tobacco: Never   Tobacco comments:    Quit smoking 08/2021  Substance and Sexual Activity   Alcohol use: No   Drug use: Not Currently    Types: Marijuana    Comment: cocaine in past   Sexual activity: Yes    Comment: 1 partner  Other Topics Concern   Not on file  Social History Narrative   Patient lives with partner in an apartment. Patient is working at [employer].    Social Determinants of Health   Financial Resource Strain: Not on file  Food Insecurity: Not on file   Transportation Needs: Not on file  Physical Activity: Not on file  Stress: Not on file  Social Connections: Not on file    No Known Allergies   Current Outpatient Medications:    dolutegravir-lamiVUDine (DOVATO) 50-300 MG tablet, Take 1 tablet by mouth daily., Disp: 30 tablet, Rfl: 11   Review of Systems  Constitutional:  Negative for activity change, appetite change, chills, diaphoresis, fatigue, fever and unexpected weight change.  HENT:  Negative for congestion, rhinorrhea, sinus pressure, sneezing, sore throat and trouble swallowing.   Eyes:  Negative for photophobia and visual disturbance.  Respiratory:  Negative for cough, chest tightness, shortness of breath, wheezing and stridor.   Cardiovascular:  Negative for chest pain, palpitations and leg swelling.  Gastrointestinal:  Negative for abdominal distention, abdominal pain, anal bleeding, blood in stool, constipation, diarrhea, nausea and vomiting.  Genitourinary:  Negative for difficulty urinating, dysuria, flank pain and hematuria.  Musculoskeletal:  Negative for arthralgias, back pain, gait problem, joint swelling and myalgias.  Skin:  Negative for color change, pallor, rash and wound.  Neurological:  Negative for dizziness, tremors, weakness and light-headedness.  Hematological:  Negative for adenopathy. Does not bruise/bleed easily.  Psychiatric/Behavioral:  Negative for agitation, behavioral problems, confusion, decreased concentration, dysphoric mood and sleep disturbance.        Objective:   Physical Exam Constitutional:      Appearance: He is well-developed.  HENT:     Head: Normocephalic and atraumatic.  Eyes:     Conjunctiva/sclera: Conjunctivae normal.  Cardiovascular:     Rate and Rhythm: Normal rate and regular rhythm.  Pulmonary:     Effort: Pulmonary effort is normal. No respiratory distress.     Breath sounds: No wheezing.  Abdominal:     General: There is no distension.     Palpations: Abdomen is  soft.  Musculoskeletal:        General: No tenderness. Normal range of motion.     Cervical back: Normal range of motion and neck supple.  Skin:    General: Skin is warm and dry.     Coloration: Skin is not pale.     Findings: No erythema or rash.  Neurological:     General: No focal deficit present.     Mental Status: He is alert and oriented to person, place, and time.  Psychiatric:        Mood and Affect: Mood normal.        Behavior: Behavior normal.        Thought Content: Thought content normal.        Judgment: Judgment normal.           Assessment & Plan:   HIV disease:  On recheck viral load CD4 CBC CMP  I am continue his Dovato  Syphilis: Is status post 3 shots of Bicillin and will follow-up RPRs  STI screening we will screen for gonococcal and urine  Vaccine counseling he does need a second hepatitis A vaccine likely will need meningococcal vaccination and HPV infection vaccination but we can see if we can find out in the [place], database

## 2022-06-24 LAB — T-HELPER CELLS (CD4) COUNT (NOT AT ARMC)
CD4 % Helper T Cell: 44 % (ref 33–65)
CD4 T Cell Abs: 539 /uL (ref 400–1790)

## 2022-06-24 LAB — MICROALBUMIN / CREATININE URINE RATIO
Creatinine, Urine: 156 mg/dL (ref 20–320)
Microalb Creat Ratio: 2149 mcg/mg creat — ABNORMAL HIGH (ref <30)
Microalb, Ur: 335.3 mg/dL

## 2022-06-29 LAB — RPR TITER: RPR Titer: 1:16 {titer} — ABNORMAL HIGH

## 2022-06-29 LAB — CBC WITH DIFFERENTIAL/PLATELET
Absolute Monocytes: 570 cells/uL (ref 200–950)
Basophils Absolute: 13 cells/uL (ref 0–200)
Basophils Relative: 0.2 %
Eosinophils Absolute: 32 cells/uL (ref 15–500)
Eosinophils Relative: 0.5 %
HCT: 42.5 % (ref 38.5–50.0)
Hemoglobin: 14.5 g/dL (ref 13.2–17.1)
Lymphs Abs: 1242 cells/uL (ref 850–3900)
MCH: 30 pg (ref 27.0–33.0)
MCHC: 34.1 g/dL (ref 32.0–36.0)
MCV: 87.8 fL (ref 80.0–100.0)
MPV: 11.7 fL (ref 7.5–12.5)
Monocytes Relative: 8.9 %
Neutro Abs: 4544 cells/uL (ref 1500–7800)
Neutrophils Relative %: 71 %
Platelets: 224 10*3/uL (ref 140–400)
RBC: 4.84 10*6/uL (ref 4.20–5.80)
RDW: 13.9 % (ref 11.0–15.0)
Total Lymphocyte: 19.4 %
WBC: 6.4 10*3/uL (ref 3.8–10.8)

## 2022-06-29 LAB — HIV-1 RNA QUANT-NO REFLEX-BLD
HIV 1 RNA Quant: NOT DETECTED Copies/mL
HIV-1 RNA Quant, Log: NOT DETECTED Log cps/mL

## 2022-06-29 LAB — FLUORESCENT TREPONEMAL AB(FTA)-IGG-BLD: Fluorescent Treponemal ABS: REACTIVE — AB

## 2022-06-29 LAB — LIPID PANEL
Cholesterol: 157 mg/dL (ref <200)
HDL: 46 mg/dL (ref >=40)
LDL Cholesterol (Calc): 94 mg/dL (calc)
Non-HDL Cholesterol (Calc): 111 mg/dL (calc) (ref <130)
Total CHOL/HDL Ratio: 3.4 (calc) (ref <5.0)
Triglycerides: 78 mg/dL (ref <150)

## 2022-06-29 LAB — COMPLETE METABOLIC PANEL WITH GFR
AG Ratio: 0.9 (calc) — ABNORMAL LOW (ref 1.0–2.5)
ALT: 22 U/L (ref 9–46)
AST: 41 U/L — ABNORMAL HIGH (ref 10–40)
Albumin: 4 g/dL (ref 3.6–5.1)
Alkaline phosphatase (APISO): 69 U/L (ref 36–130)
BUN: 15 mg/dL (ref 7–25)
CO2: 26 mmol/L (ref 20–32)
Calcium: 9.5 mg/dL (ref 8.6–10.3)
Chloride: 104 mmol/L (ref 98–110)
Creat: 1.11 mg/dL (ref 0.60–1.26)
Globulin: 4.6 g/dL (calc) — ABNORMAL HIGH (ref 1.9–3.7)
Glucose, Bld: 94 mg/dL (ref 65–99)
Potassium: 3.8 mmol/L (ref 3.5–5.3)
Sodium: 139 mmol/L (ref 135–146)
Total Bilirubin: 0.4 mg/dL (ref 0.2–1.2)
Total Protein: 8.6 g/dL — ABNORMAL HIGH (ref 6.1–8.1)
eGFR: 87 mL/min/{1.73_m2} (ref >=60)

## 2022-06-29 LAB — RPR: RPR Ser Ql: REACTIVE — AB

## 2022-10-10 ENCOUNTER — Ambulatory Visit: Payer: Self-pay

## 2022-10-10 ENCOUNTER — Other Ambulatory Visit: Payer: Self-pay

## 2022-10-10 ENCOUNTER — Other Ambulatory Visit (HOSPITAL_COMMUNITY): Payer: Self-pay

## 2022-10-12 ENCOUNTER — Other Ambulatory Visit: Payer: Self-pay | Admitting: Pharmacist

## 2022-10-12 DIAGNOSIS — B20 Human immunodeficiency virus [HIV] disease: Secondary | ICD-10-CM

## 2022-10-12 MED ORDER — DOVATO 50-300 MG PO TABS: 1.0000 | ORAL_TABLET | Freq: Every day | ORAL | 0 refills | Status: AC

## 2022-10-12 NOTE — Progress Notes (Signed)
Medication Samples have been provided to the patient.  Drug name: Dovato        Strength: 50/300 mg         Qty: 14  Tablets (1 bottles) LOT: PP3C   Exp.Date: 11/24  Dosing instructions: Take one tablet by mouth once daily  The patient has been instructed regarding the correct time, dose, and frequency of taking this medication, including desired effects and most common side effects.   Alfonse Spruce, PharmD, CPP, BCIDP, [place] Clinical Pharmacist Practitioner [facility]ious [place] [facility]

## 2022-11-30 ENCOUNTER — Other Ambulatory Visit (HOSPITAL_COMMUNITY): Payer: Self-pay

## 2022-12-23 ENCOUNTER — Other Ambulatory Visit: Payer: Self-pay

## 2022-12-23 ENCOUNTER — Encounter (HOSPITAL_COMMUNITY): Payer: Self-pay

## 2022-12-23 ENCOUNTER — Emergency Department (HOSPITAL_COMMUNITY)
Admission: EM | Admit: 2022-12-23 | Discharge: 2022-12-23 | Disposition: A | Discharge status: Home / Self Care | Payer: Self-pay | Attending: Emergency Medicine | Admitting: Emergency Medicine

## 2022-12-23 DIAGNOSIS — J101 Influenza due to other identified influenza virus with other respiratory manifestations: Secondary | ICD-10-CM | POA: Insufficient documentation

## 2022-12-23 DIAGNOSIS — Z21 Asymptomatic human immunodeficiency virus [HIV] infection status: Secondary | ICD-10-CM | POA: Insufficient documentation

## 2022-12-23 DIAGNOSIS — Z20822 Contact with and (suspected) exposure to covid-19: Secondary | ICD-10-CM | POA: Insufficient documentation

## 2022-12-23 LAB — RESP PANEL BY RT-PCR (RSV, FLU A&B, COVID)  RVPGX2
Influenza A by PCR: POSITIVE — AB
Influenza B by PCR: NEGATIVE
Resp Syncytial Virus by PCR: NEGATIVE
SARS Coronavirus 2 by RT PCR: NEGATIVE

## 2022-12-23 MED ORDER — ACETAMINOPHEN 500 MG PO TABS
1000.0000 mg | ORAL_TABLET | Freq: Once | ORAL | Status: AC
  Administered 2022-12-23: 1000 mg via ORAL
  Filled 2022-12-23: qty 2

## 2022-12-23 MED ORDER — BENZONATATE 100 MG PO CAPS: 100.0000 mg | ORAL_CAPSULE | Freq: Three times a day (TID) | ORAL | 0 refills | Status: AC

## 2022-12-23 MED ORDER — ONDANSETRON HCL 4 MG PO TABS: 4.0000 mg | ORAL_TABLET | Freq: Three times a day (TID) | ORAL | 0 refills | Status: AC | PRN

## 2022-12-23 MED ORDER — OSELTAMIVIR PHOSPHATE 75 MG PO CAPS: 75.0000 mg | ORAL_CAPSULE | Freq: Two times a day (BID) | ORAL | 0 refills | Status: AC

## 2022-12-23 MED ORDER — ACETAMINOPHEN 500 MG PO TABS: 500.0000 mg | ORAL_TABLET | Freq: Four times a day (QID) | ORAL | 0 refills | Status: AC | PRN

## 2022-12-23 NOTE — ED Triage Notes (Signed)
Pt to ED c/o cold symptoms x 2 days. Chills,  headache, nasal congestion, cough, Reports known sick contacts

## 2022-12-23 NOTE — ED Provider Notes (Signed)
[facility] EMERGENCY DEPARTMENT Provider Note   CSN: [id number] Arrival date & time: 12/23/22  1300     History  Chief Complaint  Patient presents with   URI    Sean Downs is a 41 y.o. male.  The history is provided by the patient and medical records. No language interpreter was used.  URI    41 year old male seen in history of HIV presenting today with cold symptoms.  For the past 2 days patient has endorsed fever, chills, body aches, congestion, cough, decreased appetite and having some loose stools.  He report recent sick contact in which his 32-year-old nephew has vomited on him several days prior.  He tries taking over-the-counter medication at home with some improvement.  He does endorse some nausea without vomiting.  He denies any shortness of breath no dysuria no rash.  Denies any severe headache.  Home Medications Prior to Admission medications   Medication Sig Start Date End Date Taking? Authorizing Provider  dolutegravir-lamiVUDine (DOVATO) 50-300 MG tablet Take 1 tablet by mouth daily. 06/23/22   Randall Hiss, MD      Allergies    Patient has no known allergies.    Review of Systems   Review of Systems  All other systems reviewed and are negative.   Physical Exam Updated Vital Signs BP (!) 140/86 (BP Location: Right Arm)   Pulse (!) 107   Temp 99.4 F (37.4 C) (Oral)   Resp 18   Ht 5\' 11"  (1.803 m)   Wt 81.6 kg   SpO2 97%   BMI 25.10 kg/m  Physical Exam Vitals and nursing note reviewed.  Constitutional:      General: He is not in acute distress.    Appearance: He is well-developed.  HENT:     Head: Atraumatic.  Eyes:     Conjunctiva/sclera: Conjunctivae normal.  Cardiovascular:     Rate and Rhythm: Tachycardia present.  Pulmonary:     Effort: Pulmonary effort is normal.     Breath sounds: Normal breath sounds.  Abdominal:     Palpations: Abdomen is soft.  Musculoskeletal:     Cervical back: Neck supple.   Skin:    Findings: No rash.  Neurological:     Mental Status: He is alert.     ED Results / Procedures / Treatments   Labs (all labs ordered are listed, but only abnormal results are displayed) Labs Reviewed  RESP PANEL BY RT-PCR (RSV, FLU A&B, COVID)  [id number] - Abnormal; Notable for the following components:      Result Value   Influenza A by PCR POSITIVE (*)    All other components within normal limits    EKG None  Radiology No results found.  Procedures Procedures    Medications Ordered in ED Medications  acetaminophen (TYLENOL) tablet 1,000 mg (1,000 mg Oral Given 12/23/22 1631)    ED Course/ Medical Decision Making/ A&P                           Medical Decision Making  Blood pressure (!) 129/107, pulse (!) 103, temperature 99.8 F (37.7 C), temperature source Oral, resp. rate 18, height 5\' 11"  (1.803 m), weight 81.6 kg, SpO2 96 %.  71:55 PM 41 year old male seen in history of HIV presenting today with cold symptoms.  For the past 2 days patient has endorsed fever, chills, body aches, congestion, cough, decreased appetite and having some loose stools.  He  report recent sick contact in which his 44-year-old nephew has vomited on him several days prior.  He tries taking over-the-counter medication at home with some improvement.  He does endorse some nausea without vomiting.  He denies any shortness of breath no dysuria no rash.  Denies any severe headache.  On exam this is a well-appearing male appears to be in no acute discomfort.  He reports that his viral load is undetectable and his CD4 count is unknown but he is compliant with his medication.  I suspect patient is not immunocompromise at this time however, since he is within the window dose of treatment for flu, will prescribe Tamiflu.  Patient given Tylenol for fever and tachycardia with improvement of sxs.  41 y.o. male presenting with cold sxs.  Obtained influenza A/B screen, which revealed positive influenza A.   At this time, patient's presentation most consistent with influenza.  The following were considered in the patient's differential diagnosis but was not deemed to be consistent with patient's history of present illness and/or physical examination; meningitis, pharyngitis, otitis media, pneumonia, urinary tract infection, peritonsillar abscess, retropharyngeal abscess.  As patient does not present w/ any signs/symptoms of pneumonia or other complications, deferred CXR or further labwork at this time. Educated patient on diagnosis and natural course of influenza.  Supportive care and preventive measures were discussed.  Continue fluid hydration. Follow up with primary physician in 3-5 days if symptoms continue or new problems arise. Return to ED if high fever, altered mental status, shortness of breath, uncontrolled vomiting, or other concers.  Impression: Influenza A  Plan:    * Discharge from ED   * As patient is within 48hr window of fever onset, prescribed oseltamivir 75mg  bid x5d, and instructed Pt to complete entire course.   * Advised Pt on support therapies, including rest, advancement of fluids as tolerated, thorough handwashing w/ soap and H2O, taking OTC ibuprofen or acetaminophen as directed prn for F/C and myalgias, OTC expectorant/antitussive/decongestants as directed prn, and refraining from taking ASA.   * Advised Pt to refrain from visiting work, school, or daycares or visiting pregnant women, elderly, or those w/ chronic illnesses.   * Advised Pt to obtain annual influenza vaccine upon resolution of CC.   * Advised Pt to monitor for dyspnea, respiratory distress, worsening F/C, and signs of dehydration (xerostomia, polydipsia, oliguria, weakness, and constitutional Sx). Instructed Pt to f/up w/ PCP or ER should Sx worsen or not improve. Pt verbally expressed understanding and all questions were addressed to Pt's satisfaction.         Final Clinical Impression(s) / ED  Diagnoses Final diagnoses:  Influenza A    Rx / DC Orders ED Discharge Orders          Ordered    acetaminophen (TYLENOL) 500 MG tablet  Every 6 hours PRN        12/23/22 1642    benzonatate (TESSALON) 100 MG capsule  Every 8 hours        12/23/22 1642    ondansetron (ZOFRAN) 4 MG tablet  Every 8 hours PRN        12/23/22 1642    oseltamivir (TAMIFLU) 75 MG capsule  Every 12 hours        12/23/22 1643              12/25/22, PA-C 12/23/22 1644    12/25/22, MD 12/24/22 1002

## 2022-12-23 NOTE — ED Provider Triage Note (Signed)
Emergency Medicine Provider Triage Evaluation Note  Sean Downs , a 41 y.o. male  was evaluated in triage.  Pt complains of cough, congestion, fevers, chills. States that same began days ago.  Nephew was sick with similar symptoms  Review of Systems  Positive:  Negative:   Physical Exam  BP (!) 140/86 (BP Location: Right Arm)   Pulse (!) 107   Temp 99.4 F (37.4 C) (Oral)   Resp 18   Ht 5\' 11"  (1.803 m)   Wt 81.6 kg   SpO2 97%   BMI 25.10 kg/m  Gen:   Awake, no distress   Resp:  Normal effort  MSK:   Moves extremities without difficulty  Other:    Medical Decision Making  Medically screening exam initiated at 1:59 PM.  Appropriate orders placed.  Sean Downs was informed that the remainder of the evaluation will be completed by another provider, this initial triage assessment does not replace that evaluation, and the importance of remaining in the ED until their evaluation is complete.     Rella Larve, PA-C 12/23/22 1400

## 2022-12-23 NOTE — Discharge Instructions (Addendum)
You have tested positive for influenza A infection.  Take Tamiflu as prescribed.  I have also provided additional medication to help with your symptoms.  Return if you have any concern.

## 2023-01-09 ENCOUNTER — Ambulatory Visit: Payer: Self-pay | Admitting: Infectious Diseases

## 2023-01-09 ENCOUNTER — Ambulatory Visit: Payer: Self-pay | Admitting: Infectious Disease

## 2023-01-25 ENCOUNTER — Telehealth: Payer: Self-pay

## 2023-01-25 NOTE — Telephone Encounter (Signed)
Sean Downs with [facility] called office stating patient is contact for syphillis. Patient told [facility] he would schedule appointment with office for STD testing.  No appointment scheduled at this time. Will send mychart message. Leatrice Jewels, RMA

## 2023-01-27 ENCOUNTER — Other Ambulatory Visit: Payer: Self-pay

## 2023-01-27 DIAGNOSIS — B20 Human immunodeficiency virus [HIV] disease: Secondary | ICD-10-CM

## 2023-01-27 DIAGNOSIS — Z113 Encounter for screening for infections with a predominantly sexual mode of transmission: Secondary | ICD-10-CM

## 2023-02-01 ENCOUNTER — Other Ambulatory Visit: Payer: Self-pay

## 2023-02-01 DIAGNOSIS — Z113 Encounter for screening for infections with a predominantly sexual mode of transmission: Secondary | ICD-10-CM

## 2023-02-01 DIAGNOSIS — B20 Human immunodeficiency virus [HIV] disease: Secondary | ICD-10-CM

## 2023-02-05 LAB — CBC WITH DIFFERENTIAL/PLATELET
Absolute Monocytes: 365 cells/uL (ref 200–950)
Basophils Absolute: 41 cells/uL (ref 0–200)
Basophils Relative: 1 %
Eosinophils Absolute: 180 cells/uL (ref 15–500)
Eosinophils Relative: 4.4 %
HCT: 45.1 % (ref 38.5–50.0)
Hemoglobin: 15.6 g/dL (ref 13.2–17.1)
Lymphs Abs: 1398 cells/uL (ref 850–3900)
MCH: 30.8 pg (ref 27.0–33.0)
MCHC: 34.6 g/dL (ref 32.0–36.0)
MCV: 89.1 fL (ref 80.0–100.0)
MPV: 10.3 fL (ref 7.5–12.5)
Monocytes Relative: 8.9 %
Neutro Abs: 2116 cells/uL (ref 1500–7800)
Neutrophils Relative %: 51.6 %
Platelets: 310 10*3/uL (ref 140–400)
RBC: 5.06 10*6/uL (ref 4.20–5.80)
RDW: 12.9 % (ref 11.0–15.0)
Total Lymphocyte: 34.1 %
WBC: 4.1 10*3/uL (ref 3.8–10.8)

## 2023-02-05 LAB — COMPLETE METABOLIC PANEL WITH GFR
AG Ratio: 1.1 (calc) (ref 1.0–2.5)
ALT: 31 U/L (ref 9–46)
AST: 44 U/L — ABNORMAL HIGH (ref 10–40)
Albumin: 4.2 g/dL (ref 3.6–5.1)
Alkaline phosphatase (APISO): 75 U/L (ref 36–130)
BUN: 14 mg/dL (ref 7–25)
CO2: 28 mmol/L (ref 20–32)
Calcium: 9.8 mg/dL (ref 8.6–10.3)
Chloride: 106 mmol/L (ref 98–110)
Creat: 1.14 mg/dL (ref 0.60–1.26)
Globulin: 3.9 g/dL (calc) — ABNORMAL HIGH (ref 1.9–3.7)
Glucose, Bld: 97 mg/dL (ref 65–99)
Potassium: 4.3 mmol/L (ref 3.5–5.3)
Sodium: 140 mmol/L (ref 135–146)
Total Bilirubin: 0.4 mg/dL (ref 0.2–1.2)
Total Protein: 8.1 g/dL (ref 6.1–8.1)
eGFR: 84 mL/min/{1.73_m2} (ref >=60)

## 2023-02-05 LAB — T-HELPER CELLS (CD4) COUNT (NOT AT ARMC)
Absolute CD4: 627 cells/uL (ref 490–1740)
CD4 T Helper %: 38 % (ref 30–61)
Total lymphocyte count: 1650 cells/uL (ref 850–3900)

## 2023-02-05 LAB — C. TRACHOMATIS/N. GONORRHOEAE RNA
C. trachomatis RNA, TMA: NOT DETECTED
N. gonorrhoeae RNA, TMA: NOT DETECTED

## 2023-02-05 LAB — T PALLIDUM AB: T Pallidum Abs: POSITIVE — AB

## 2023-02-05 LAB — HIV-1 RNA QUANT-NO REFLEX-BLD
HIV 1 RNA Quant: NOT DETECTED Copies/mL
HIV-1 RNA Quant, Log: NOT DETECTED Log cps/mL

## 2023-02-05 LAB — RPR: RPR Ser Ql: REACTIVE — AB

## 2023-02-05 LAB — RPR TITER: RPR Titer: 1:16 {titer} — ABNORMAL HIGH

## 2023-02-14 ENCOUNTER — Ambulatory Visit: Payer: Self-pay | Admitting: Internal Medicine

## 2023-07-10 ENCOUNTER — Other Ambulatory Visit: Payer: Self-pay

## 2023-07-10 DIAGNOSIS — B20 Human immunodeficiency virus [HIV] disease: Secondary | ICD-10-CM

## 2023-07-10 MED ORDER — DOVATO 50-300 MG PO TABS: 1.0000 | ORAL_TABLET | Freq: Every day | ORAL | 0 refills | Status: DC

## 2023-07-11 ENCOUNTER — Telehealth: Payer: Self-pay

## 2023-07-11 ENCOUNTER — Other Ambulatory Visit (HOSPITAL_COMMUNITY): Payer: Self-pay

## 2023-07-11 NOTE — Telephone Encounter (Signed)
[facility] Patient Advocate Encounter   Was successful in obtaining a [employer] copay card for Biktarvy.  This copay card will make the patients copay $0.00.  I have spoken with the patient.         Donna Dean, CPhT [facility] Phone: [phone] Fax:  [phone]  

## 2023-08-01 NOTE — Progress Notes (Deleted)
Subjective:  Chief complaint: ffollowup for HIV on Dovato  Patient ID: Sean Downs, male    DOB: 1981-03-29, 42 y.o.   MRN: [id number]  HPI   42 year old Black man newly diagnosed with  HIV with a history of kidney stones. He was seen by [facility] and Margarite Gouge with [facility] for STI treatment with 3rd bicillin shot for syphilis with his partner who is HIV + but perfectly controlled now on Cabenuva.   Patient had HIV test, 4th generation which was positive.   He came to establish care and we initiated rapid same-day treatment with Dovato.  His viral load is come down to 11,000 to less than 20 and then not detected.  He is having no trouble tolerating the Dovato and is in good spirits he did not want any vaccines today he also only wanted testing of the urine for gonorrhea chlamydia not oropharyngeal or rectal swabs.        Past Medical History:  Diagnosis Date  . HIV disease (HCC) 11/03/2021  . Kidney stone   . Syphilis 11/03/2021    No past surgical history on file.  Family History  Problem Relation Age of Onset  . Cancer Sister   . Depression Brother   . Seizures Brother       Social History   Socioeconomic History  . Marital status: Single    Spouse name: Not on file  . Number of children: Not on file  . Years of education: Not on file  . Highest education level: Not on file  Occupational History  . Not on file  Tobacco Use  . Smoking status: Former    Types: Cigarettes  . Smokeless tobacco: Never  . Tobacco comments:    Quit smoking 08/2021  Substance and Sexual Activity  . Alcohol use: No  . Drug use: Not Currently    Types: Marijuana    Comment: cocaine in past  . Sexual activity: Yes    Comment: 1 partner  Other Topics Concern  . Not on file  Social History Narrative   Patient lives with partner in an apartment. Patient is working at [employer].    Social Determinants of Health   Financial Resource Strain: Not on file  Food  Insecurity: Not on file  Transportation Needs: Not on file  Physical Activity: Not on file  Stress: Not on file  Social Connections: Not on file    No Known Allergies   Current Outpatient Medications:  .  acetaminophen (TYLENOL) 500 MG tablet, Take 1 tablet (500 mg total) by mouth every 6 (six) hours as needed., Disp: 30 tablet, Rfl: 0 .  benzonatate (TESSALON) 100 MG capsule, Take 1 capsule (100 mg total) by mouth every 8 (eight) hours., Disp: 21 capsule, Rfl: 0 .  dolutegravir-lamiVUDine (DOVATO) 50-300 MG tablet, Take 1 tablet by mouth daily., Disp: 30 tablet, Rfl: 0 .  ondansetron (ZOFRAN) 4 MG tablet, Take 1 tablet (4 mg total) by mouth every 8 (eight) hours as needed for nausea or vomiting., Disp: 12 tablet, Rfl: 0 .  oseltamivir (TAMIFLU) 75 MG capsule, Take 1 capsule (75 mg total) by mouth every 12 (twelve) hours., Disp: 10 capsule, Rfl: 0   Review of Systems  Constitutional:  Negative for activity change, appetite change, chills, diaphoresis, fatigue, fever and unexpected weight change.  HENT:  Negative for congestion, rhinorrhea, sinus pressure, sneezing, sore throat and trouble swallowing.   Eyes:  Negative for photophobia and visual disturbance.  Respiratory:  Negative for cough, chest tightness, shortness of breath, wheezing and stridor.   Cardiovascular:  Negative for chest pain, palpitations and leg swelling.  Gastrointestinal:  Negative for abdominal distention, abdominal pain, anal bleeding, blood in stool, constipation, diarrhea, nausea and vomiting.  Genitourinary:  Negative for difficulty urinating, dysuria, flank pain and hematuria.  Musculoskeletal:  Negative for arthralgias, back pain, gait problem, joint swelling and myalgias.  Skin:  Negative for color change, pallor, rash and wound.  Neurological:  Negative for dizziness, tremors, weakness and light-headedness.  Hematological:  Negative for adenopathy. Does not bruise/bleed easily.  Psychiatric/Behavioral:   Negative for agitation, behavioral problems, confusion, decreased concentration, dysphoric mood and sleep disturbance.        Objective:   Physical Exam Constitutional:      Appearance: He is well-developed.  HENT:     Head: Normocephalic and atraumatic.  Eyes:     Conjunctiva/sclera: Conjunctivae normal.  Cardiovascular:     Rate and Rhythm: Normal rate and regular rhythm.  Pulmonary:     Effort: Pulmonary effort is normal. No respiratory distress.     Breath sounds: No wheezing.  Abdominal:     General: There is no distension.     Palpations: Abdomen is soft.  Musculoskeletal:        General: No tenderness. Normal range of motion.     Cervical back: Normal range of motion and neck supple.  Skin:    General: Skin is warm and dry.     Coloration: Skin is not pale.     Findings: No erythema or rash.  Neurological:     General: No focal deficit present.     Mental Status: He is alert and oriented to person, place, and time.  Psychiatric:        Mood and Affect: Mood normal.        Behavior: Behavior normal.        Thought Content: Thought content normal.        Judgment: Judgment normal.          Assessment & Plan:   HIV disease:  On recheck viral load CD4 CBC CMP  I am continue his Dovato  Syphilis: Is status post 3 shots of Bicillin and will follow-up RPRs  STI screening we will screen for gonococcal and urine  Vaccine counseling he does need a second hepatitis A vaccine likely will need meningococcal vaccination and HPV infection vaccination but we can see if we can find out in the [place], database

## 2023-08-02 ENCOUNTER — Ambulatory Visit: Payer: Commercial Managed Care - HMO | Admitting: Infectious Disease

## 2023-08-02 DIAGNOSIS — B20 Human immunodeficiency virus [HIV] disease: Secondary | ICD-10-CM

## 2023-08-02 DIAGNOSIS — A539 Syphilis, unspecified: Secondary | ICD-10-CM

## 2023-08-07 ENCOUNTER — Other Ambulatory Visit: Payer: Self-pay | Admitting: Infectious Disease

## 2023-08-07 ENCOUNTER — Other Ambulatory Visit (HOSPITAL_COMMUNITY): Payer: Self-pay

## 2023-08-07 DIAGNOSIS — B20 Human immunodeficiency virus [HIV] disease: Secondary | ICD-10-CM

## 2023-08-07 NOTE — Telephone Encounter (Signed)
Patient needs appointment. Has missed 2 consecutive appointments (01/2023, 07/2023).  Sandie Ano, RN

## 2023-08-17 ENCOUNTER — Other Ambulatory Visit: Payer: Self-pay

## 2023-08-17 ENCOUNTER — Ambulatory Visit (INDEPENDENT_AMBULATORY_CARE_PROVIDER_SITE_OTHER): Payer: Commercial Managed Care - HMO | Admitting: Infectious Disease

## 2023-08-17 ENCOUNTER — Encounter: Payer: Self-pay | Admitting: Infectious Disease

## 2023-08-17 ENCOUNTER — Ambulatory Visit: Payer: Commercial Managed Care - HMO

## 2023-08-17 ENCOUNTER — Other Ambulatory Visit (HOSPITAL_COMMUNITY)
Admission: RE | Admit: 2023-08-17 | Discharge: 2023-08-17 | Disposition: A | Discharge status: Home / Self Care | Payer: Commercial Managed Care - HMO | Source: Ambulatory Visit | Attending: Infectious Disease | Admitting: Infectious Disease

## 2023-08-17 VITALS — BP 123/86 | HR 89 | Temp 98.1°F | Ht 71.0 in | Wt 178.0 lb

## 2023-08-17 DIAGNOSIS — Z7185 Encounter for immunization safety counseling: Secondary | ICD-10-CM | POA: Diagnosis not present

## 2023-08-17 DIAGNOSIS — B20 Human immunodeficiency virus [HIV] disease: Secondary | ICD-10-CM

## 2023-08-17 DIAGNOSIS — E785 Hyperlipidemia, unspecified: Secondary | ICD-10-CM

## 2023-08-17 DIAGNOSIS — A539 Syphilis, unspecified: Secondary | ICD-10-CM

## 2023-08-17 DIAGNOSIS — Z23 Encounter for immunization: Secondary | ICD-10-CM | POA: Diagnosis not present

## 2023-08-17 HISTORY — DX: Encounter for immunization safety counseling: Z71.85

## 2023-08-17 HISTORY — DX: Hyperlipidemia, unspecified: E78.5

## 2023-08-17 MED ORDER — DOVATO 50-300 MG PO TABS
1.0000 | ORAL_TABLET | Freq: Every day | ORAL | 11 refills | Status: DC
Start: 2023-08-17 — End: 2024-09-09

## 2023-08-17 NOTE — Progress Notes (Signed)
Subjective:  Chief complaint: ffollowup for HIV on Dovato  Patient ID: Sean Downs, male    DOB: 07/26/1981, 42 y.o.   MRN: [id number]  HPI   42 year old black man with HIV that was initially treated with auto then on Cabenuva but now switched back to Dovato.          Past Medical History:  Diagnosis Date   HIV disease (HCC) 11/03/2021   Kidney stone    Syphilis 11/03/2021    No past surgical history on file.  Family History  Problem Relation Age of Onset   Cancer Sister    Depression Brother    Seizures Brother       Social History   Socioeconomic History   Marital status: Single    Spouse name: Not on file   Number of children: Not on file   Years of education: Not on file   Highest education level: Not on file  Occupational History   Not on file  Tobacco Use   Smoking status: Former    Types: Cigarettes   Smokeless tobacco: Never   Tobacco comments:    Quit smoking 08/2021  Substance and Sexual Activity   Alcohol use: No   Drug use: Not Currently    Types: Marijuana    Comment: cocaine in past   Sexual activity: Yes    Comment: 1 partner  Other Topics Concern   Not on file  Social History Narrative   Patient lives with partner in an apartment. Patient is working at [employer].    Social Determinants of Health   Financial Resource Strain: Not on file  Food Insecurity: Not on file  Transportation Needs: Not on file  Physical Activity: Not on file  Stress: Not on file  Social Connections: Not on file    No Known Allergies   Current Outpatient Medications:    acetaminophen (TYLENOL) 500 MG tablet, Take 1 tablet (500 mg total) by mouth every 6 (six) hours as needed., Disp: 30 tablet, Rfl: 0   benzonatate (TESSALON) 100 MG capsule, Take 1 capsule (100 mg total) by mouth every 8 (eight) hours., Disp: 21 capsule, Rfl: 0   DOVATO 50-300 MG tablet, TAKE 1 TABLET BY MOUTH DAILY, Disp: 30 tablet, Rfl: 0   ondansetron (ZOFRAN) 4 MG tablet, Take  1 tablet (4 mg total) by mouth every 8 (eight) hours as needed for nausea or vomiting., Disp: 12 tablet, Rfl: 0   oseltamivir (TAMIFLU) 75 MG capsule, Take 1 capsule (75 mg total) by mouth every 12 (twelve) hours., Disp: 10 capsule, Rfl: 0   Review of Systems  Constitutional:  Negative for activity change, appetite change, chills, diaphoresis, fatigue, fever and unexpected weight change.  HENT:  Negative for congestion, rhinorrhea, sinus pressure, sneezing, sore throat and trouble swallowing.   Eyes:  Negative for photophobia and visual disturbance.  Respiratory:  Negative for cough, chest tightness, shortness of breath, wheezing and stridor.   Cardiovascular:  Negative for chest pain, palpitations and leg swelling.  Gastrointestinal:  Negative for abdominal distention, abdominal pain, anal bleeding, blood in stool, constipation, diarrhea, nausea and vomiting.  Genitourinary:  Negative for difficulty urinating, dysuria, flank pain and hematuria.  Musculoskeletal:  Negative for arthralgias, back pain, gait problem, joint swelling and myalgias.  Skin:  Negative for color change, pallor, rash and wound.  Neurological:  Negative for dizziness, tremors, weakness and light-headedness.  Hematological:  Negative for adenopathy. Does not bruise/bleed easily.  Psychiatric/Behavioral:  Negative for agitation, behavioral problems,  confusion, decreased concentration, dysphoric mood and sleep disturbance.        Objective:   Physical Exam Constitutional:      Appearance: He is well-developed.  HENT:     Head: Normocephalic and atraumatic.  Eyes:     Conjunctiva/sclera: Conjunctivae normal.  Cardiovascular:     Rate and Rhythm: Normal rate and regular rhythm.  Pulmonary:     Effort: Pulmonary effort is normal. No respiratory distress.     Breath sounds: No wheezing.  Abdominal:     General: There is no distension.     Palpations: Abdomen is soft.  Musculoskeletal:        General: No tenderness.  Normal range of motion.     Cervical back: Normal range of motion and neck supple.  Skin:    General: Skin is warm and dry.     Coloration: Skin is not pale.     Findings: No erythema or rash.  Neurological:     General: No focal deficit present.     Mental Status: He is alert and oriented to person, place, and time.  Psychiatric:        Mood and Affect: Mood normal.        Behavior: Behavior normal.        Thought Content: Thought content normal.        Judgment: Judgment normal.           Assessment & Plan:   HIV disease:  I will add order HIV viral load CD4 count CBC with differential CMP, RPR GC and chlamydia and I will continue  [employer], prescription   Will recheck RPR titer  STI screening    Vaccine counseling needs second hepatitis A shot as well as tetanus shot.  Should get COVID and flu shots this fall

## 2023-08-17 NOTE — Addendum Note (Signed)
Addended by: Philippa Chester on: 08/17/2023 11:44 AM   Modules accepted: Orders

## 2023-08-18 LAB — URINE CYTOLOGY ANCILLARY ONLY
Chlamydia: NEGATIVE
Comment: NEGATIVE
Comment: NORMAL
Neisseria Gonorrhea: POSITIVE — AB

## 2023-08-18 LAB — T-HELPER CELLS (CD4) COUNT (NOT AT ARMC)
CD4 % Helper T Cell: 50 % (ref 33–65)
CD4 T Cell Abs: 743 /uL (ref 400–1790)

## 2023-08-21 ENCOUNTER — Telehealth: Payer: Self-pay

## 2023-08-21 NOTE — Telephone Encounter (Signed)
Patient returned call - patient aware and scheduled on 8/29 for treatment.   Mathews Stuhr Lesli Albee, CMA

## 2023-08-21 NOTE — Telephone Encounter (Signed)
Attempted to call patient regarding results. Not able to reach him at this time. Left voicemail requesting call back. Juanita Laster, RMA

## 2023-08-21 NOTE — Telephone Encounter (Signed)
-----   Message from [place] sent at 08/19/2023 11:07 PM EDT ----- Regarding: FW: Pt w gonorrhea needs 500 mg IM ceftriaxone [facility] need rx ----- Message ----- From: Janace Hoard Lab Results In Sent: 08/17/2023   3:28 PM EDT To: Randall Hiss, MD

## 2023-08-23 LAB — CBC WITH DIFFERENTIAL/PLATELET
Absolute Monocytes: 374 {cells}/uL (ref 200–950)
Basophils Absolute: 42 {cells}/uL (ref 0–200)
Basophils Relative: 1 %
Eosinophils Absolute: 353 cells/uL (ref 15–500)
Eosinophils Relative: 8.4 %
HCT: 45.8 % (ref 38.5–50.0)
Hemoglobin: 15.4 g/dL (ref 13.2–17.1)
Lymphs Abs: 1562 {cells}/uL (ref 850–3900)
MCH: 30 pg (ref 27.0–33.0)
MCHC: 33.6 g/dL (ref 32.0–36.0)
MCV: 89.3 fL (ref 80.0–100.0)
MPV: 10.8 fL (ref 7.5–12.5)
Monocytes Relative: 8.9 %
Neutro Abs: 1869 {cells}/uL (ref 1500–7800)
Neutrophils Relative %: 44.5 %
Platelets: 318 10*3/uL (ref 140–400)
RBC: 5.13 10*6/uL (ref 4.20–5.80)
RDW: 13 % (ref 11.0–15.0)
Total Lymphocyte: 37.2 %
WBC: 4.2 10*3/uL (ref 3.8–10.8)

## 2023-08-23 LAB — COMPLETE METABOLIC PANEL WITH GFR
AG Ratio: 1.1 (calc) (ref 1.0–2.5)
ALT: 23 U/L (ref 9–46)
AST: 37 U/L (ref 10–40)
Albumin: 4.1 g/dL (ref 3.6–5.1)
Alkaline phosphatase (APISO): 75 U/L (ref 36–130)
BUN: 17 mg/dL (ref 7–25)
CO2: 25 mmol/L (ref 20–32)
Calcium: 9.7 mg/dL (ref 8.6–10.3)
Chloride: 105 mmol/L (ref 98–110)
Creat: 1.12 mg/dL (ref 0.60–1.26)
Globulin: 3.9 g/dL (calc) — ABNORMAL HIGH (ref 1.9–3.7)
Glucose, Bld: 90 mg/dL (ref 65–99)
Potassium: 3.9 mmol/L (ref 3.5–5.3)
Sodium: 139 mmol/L (ref 135–146)
Total Bilirubin: 0.5 mg/dL (ref 0.2–1.2)
Total Protein: 8 g/dL (ref 6.1–8.1)
eGFR: 86 mL/min/{1.73_m2} (ref >=60)

## 2023-08-23 LAB — LIPID PANEL
Cholesterol: 188 mg/dL (ref <200)
HDL: 43 mg/dL (ref >=40)
LDL Cholesterol (Calc): 120 mg/dL — ABNORMAL HIGH
Non-HDL Cholesterol (Calc): 145 mg/dL — ABNORMAL HIGH (ref <130)
Total CHOL/HDL Ratio: 4.4 (calc) (ref <5.0)
Triglycerides: 141 mg/dL (ref <150)

## 2023-08-23 LAB — RPR TITER: RPR Titer: 1:16 {titer} — ABNORMAL HIGH

## 2023-08-23 LAB — HIV-1 RNA QUANT-NO REFLEX-BLD
HIV 1 RNA Quant: NOT DETECTED {copies}/mL
HIV-1 RNA Quant, Log: NOT DETECTED {Log_copies}/mL

## 2023-08-23 LAB — RPR: RPR Ser Ql: REACTIVE — AB

## 2023-08-23 LAB — T PALLIDUM AB: T Pallidum Abs: POSITIVE — AB

## 2023-08-24 ENCOUNTER — Other Ambulatory Visit: Payer: Self-pay

## 2023-08-24 ENCOUNTER — Ambulatory Visit (INDEPENDENT_AMBULATORY_CARE_PROVIDER_SITE_OTHER): Payer: Commercial Managed Care - HMO

## 2023-08-24 DIAGNOSIS — A549 Gonococcal infection, unspecified: Secondary | ICD-10-CM

## 2023-08-24 MED ORDER — CEFTRIAXONE SODIUM 500 MG IJ SOLR
500.0000 mg | Freq: Once | INTRAMUSCULAR | Status: AC
Start: 2023-08-24 — End: 2023-08-24
  Administered 2023-08-24: 500 mg via INTRAMUSCULAR

## 2023-09-08 ENCOUNTER — Other Ambulatory Visit: Payer: Self-pay | Admitting: Infectious Disease

## 2023-09-08 DIAGNOSIS — B20 Human immunodeficiency virus [HIV] disease: Secondary | ICD-10-CM

## 2023-10-18 ENCOUNTER — Emergency Department (HOSPITAL_COMMUNITY)
Admission: EM | Admit: 2023-10-18 | Discharge: 2023-10-18 | Discharge status: Against Medical Advice | Payer: Managed Care, Other (non HMO) | Attending: Emergency Medicine | Admitting: Emergency Medicine

## 2023-10-18 ENCOUNTER — Other Ambulatory Visit: Payer: Self-pay

## 2023-10-18 DIAGNOSIS — Z5321 Procedure and treatment not carried out due to patient leaving prior to being seen by health care provider: Secondary | ICD-10-CM | POA: Insufficient documentation

## 2023-10-18 DIAGNOSIS — H5789 Other specified disorders of eye and adnexa: Secondary | ICD-10-CM | POA: Diagnosis present

## 2023-10-18 MED ORDER — TETRACAINE HCL 0.5 % OP SOLN: 1.0000 [drp] | Freq: Once | OPHTHALMIC | Status: DC

## 2023-10-18 MED ORDER — FLUORESCEIN SODIUM 1 MG OP STRP: 1.0000 | ORAL_STRIP | Freq: Once | OPHTHALMIC | Status: DC

## 2023-10-18 NOTE — ED Triage Notes (Signed)
Pt. Stated, I was working and some of the cleaning solution dripped in my rt. Eye. I rinsed my eye out but its still irritated this morning . Didn't rinse my eyes today

## 2023-10-18 NOTE — ED Notes (Signed)
Pt states eyes feel better after flushing with water

## 2023-10-18 NOTE — ED Notes (Signed)
Pt taken to eyewash station with instructions per PA Laveda Norman to flush eyes for 15 minutes; pt verbalized understating of instructions

## 2023-10-18 NOTE — ED Notes (Addendum)
[name], PA notified of pt complaining of increased eye burning.  Pt placed in chair at triage and waiting for PA to evaluate.  Spoke with patient and updated him that PA will be coming to triage to see him.  Updated [name], Triage RN.

## 2023-10-18 NOTE — ED Provider Triage Note (Signed)
Emergency Medicine Provider Triage Evaluation Note  Sean Downs , a 42 y.o. male  was evaluated in triage.  Pt complains of eye irritation. Yesterday pt was cleaning an oven at work when the [employer] solution dripped onto his R eye.  Report irritation to the R eye with some blurry vision.  Did try rinsing his eyes this AM at home.  Unsure last tdap.  Does not wear contact lens.    Review of Systems  Positive: As above Negative: As above  Physical Exam  BP (!) 137/104 (BP Location: Right Arm)   Pulse 94   Temp (!) 97.1 F (36.2 C) (Oral)   Resp 17   Ht 5\' 11"  (1.803 m)   Wt 77.1 kg   SpO2 99%   BMI 23.71 kg/m  Gen:   Awake, no distress   Resp:  Normal effort  MSK:   Moves extremities without difficulty  Other:    Medical Decision Making  Medically screening exam initiated at 1:37 PM.  Appropriate orders placed.  Sean Downs was informed that the remainder of the evaluation will be completed by another provider, this initial triage assessment does not replace that evaluation, and the importance of remaining in the ED until their evaluation is complete.  Please rinse eyes at eye station.    Sean Helper, PA-C 10/18/23 1340

## 2023-10-18 NOTE — ED Notes (Signed)
Patient came to me about the chemical that got in his eye patient stated that his eyes were burning really bad so I went to triage to notified RN Hannie of eye problem she was busy with the code stroke  so, I went to get Clydie Braun so Clydie Braun advised me to bring him in to triage to be seen by the PA.

## 2024-05-13 NOTE — Progress Notes (Signed)
 The 10-year ASCVD risk score (Arnett DK, et al., 2019) is: 3.6%   Values used to calculate the score:     Age: 43 years     Sex: Male     Is Non-Hispanic African American: Yes     Diabetic: No     Tobacco smoker: No     Systolic Blood Pressure: 137 mmHg     Is BP treated: No     HDL Cholesterol: 43 mg/dL     Total Cholesterol: 188 mg/dL  Arlon Bergamo, BSN, RN

## 2024-09-09 ENCOUNTER — Other Ambulatory Visit: Payer: Self-pay | Admitting: Infectious Disease

## 2024-09-09 DIAGNOSIS — B20 Human immunodeficiency virus [HIV] disease: Secondary | ICD-10-CM

## 2024-09-10 ENCOUNTER — Telehealth: Payer: Self-pay

## 2024-09-10 NOTE — Telephone Encounter (Signed)
 Pt is on the scheduling list and due for an appt with Dr. Fleeta Rothman. I LVM requesting a callback, no recent MyChart activity.

## 2024-09-22 NOTE — Progress Notes (Unsigned)
   Subjective:  Chief complaint: follow-up for HIV disease on medications   Patient ID: Sean Downs, male    DOB: February 17, 1981, 43 y.o.   MRN: [id number]  HPI  Past Medical History:  Diagnosis Date   HIV disease (HCC) 11/03/2021   Hyperlipidemia 08/17/2023   Kidney stone    Syphilis 11/03/2021   Vaccine counseling 08/17/2023    No past surgical history on file.  Family History  Problem Relation Age of Onset   Cancer Sister    Depression Brother    Seizures Brother       Social History   Socioeconomic History   Marital status: Single    Spouse name: Not on file   Number of children: Not on file   Years of education: Not on file   Highest education level: Not on file  Occupational History   Not on file  Tobacco Use   Smoking status: Former    Types: Cigarettes   Smokeless tobacco: Never   Tobacco comments:    Quit smoking 08/2021  Substance and Sexual Activity   Alcohol use: No   Drug use: Not Currently    Types: Marijuana    Comment: cocaine in past   Sexual activity: Yes    Comment: 1 partner  Other Topics Concern   Not on file  Social History Narrative   Patient lives with partner in an apartment. Patient is working at [employer].    Social Drivers of Corporate investment banker Strain: Not on file  Food Insecurity: Not on file  Transportation Needs: Not on file  Physical Activity: Not on file  Stress: Not on file  Social Connections: Not on file    No Known Allergies   Current Outpatient Medications:    acetaminophen  (TYLENOL ) 500 MG tablet, Take 1 tablet (500 mg total) by mouth every 6 (six) hours as needed., Disp: 30 tablet, Rfl: 0   benzonatate  (TESSALON ) 100 MG capsule, Take 1 capsule (100 mg total) by mouth every 8 (eight) hours. (Patient not taking: Reported on 08/17/2023), Disp: 21 capsule, Rfl: 0   DOVATO  50-300 MG tablet, TAKE 1 TABLET BY MOUTH DAILY, Disp: 30 tablet, Rfl: 0   ondansetron  (ZOFRAN ) 4 MG tablet, Take 1 tablet (4 mg total)  by mouth every 8 (eight) hours as needed for nausea or vomiting. (Patient not taking: Reported on 08/17/2023), Disp: 12 tablet, Rfl: 0   oseltamivir  (TAMIFLU ) 75 MG capsule, Take 1 capsule (75 mg total) by mouth every 12 (twelve) hours. (Patient not taking: Reported on 08/17/2023), Disp: 10 capsule, Rfl: 0   Review of Systems     Objective:   Physical Exam        Assessment & Plan:

## 2024-09-23 ENCOUNTER — Encounter: Payer: Self-pay | Admitting: Infectious Disease

## 2024-09-23 ENCOUNTER — Other Ambulatory Visit: Payer: Self-pay

## 2024-09-23 ENCOUNTER — Ambulatory Visit: Payer: Self-pay | Admitting: Infectious Disease

## 2024-09-23 VITALS — BP 141/94 | HR 86 | Temp 98.3°F | Ht 71.0 in | Wt 183.0 lb

## 2024-09-23 DIAGNOSIS — Z1212 Encounter for screening for malignant neoplasm of rectum: Secondary | ICD-10-CM

## 2024-09-23 DIAGNOSIS — B20 Human immunodeficiency virus [HIV] disease: Secondary | ICD-10-CM

## 2024-09-23 DIAGNOSIS — E785 Hyperlipidemia, unspecified: Secondary | ICD-10-CM

## 2024-09-23 DIAGNOSIS — Z7185 Encounter for immunization safety counseling: Secondary | ICD-10-CM

## 2024-09-23 MED ORDER — DOVATO 50-300 MG PO TABS: 1.0000 | ORAL_TABLET | Freq: Every day | ORAL | 11 refills | Status: AC

## 2024-09-23 MED ORDER — ATORVASTATIN CALCIUM 20 MG PO TABS: 20.0000 mg | ORAL_TABLET | Freq: Every day | ORAL | 11 refills | Status: AC

## 2024-09-24 LAB — URINE CYTOLOGY ANCILLARY ONLY
Chlamydia: NEGATIVE
Comment: NEGATIVE
Comment: NORMAL
Neisseria Gonorrhea: NEGATIVE

## 2024-09-24 LAB — T-HELPER CELLS (CD4) COUNT (NOT AT ARMC)
CD4 % Helper T Cell: 54 % (ref 33–65)
CD4 T Cell Abs: 564 /uL (ref 400–1790)

## 2024-09-26 LAB — COMPLETE METABOLIC PANEL WITHOUT GFR
AG Ratio: 1.1 (calc) (ref 1.0–2.5)
ALT: 20 U/L (ref 9–46)
AST: 40 U/L (ref 10–40)
Albumin: 3.7 g/dL (ref 3.6–5.1)
Alkaline phosphatase (APISO): 63 U/L (ref 36–130)
BUN: 12 mg/dL (ref 7–25)
CO2: 30 mmol/L (ref 20–32)
Calcium: 9.1 mg/dL (ref 8.6–10.3)
Chloride: 105 mmol/L (ref 98–110)
Creat: 0.91 mg/dL (ref 0.60–1.29)
Globulin: 3.3 g/dL (ref 1.9–3.7)
Glucose, Bld: 99 mg/dL (ref 65–99)
Potassium: 4.2 mmol/L (ref 3.5–5.3)
Sodium: 139 mmol/L (ref 135–146)
Total Bilirubin: 0.3 mg/dL (ref 0.2–1.2)
Total Protein: 7 g/dL (ref 6.1–8.1)

## 2024-09-26 LAB — CBC WITH DIFFERENTIAL/PLATELET
Absolute Lymphocytes: 1212 {cells}/uL (ref 850–3900)
Absolute Monocytes: 270 {cells}/uL (ref 200–950)
Basophils Absolute: 42 {cells}/uL (ref 0–200)
Basophils Relative: 1.1 %
Eosinophils Absolute: 293 {cells}/uL (ref 15–500)
Eosinophils Relative: 7.7 %
HCT: 44.7 % (ref 38.5–50.0)
Hemoglobin: 14.6 g/dL (ref 13.2–17.1)
MCH: 30 pg (ref 27.0–33.0)
MCHC: 32.7 g/dL (ref 32.0–36.0)
MCV: 91.8 fL (ref 80.0–100.0)
MPV: 10.5 fL (ref 7.5–12.5)
Monocytes Relative: 7.1 %
Neutro Abs: 1984 {cells}/uL (ref 1500–7800)
Neutrophils Relative %: 52.2 %
Platelets: 308 Thousand/uL (ref 140–400)
RBC: 4.87 Million/uL (ref 4.20–5.80)
RDW: 13.2 % (ref 11.0–15.0)
Total Lymphocyte: 31.9 %
WBC: 3.8 Thousand/uL (ref 3.8–10.8)

## 2024-09-26 LAB — LIPID PANEL
Cholesterol: 152 mg/dL (ref <200)
HDL: 41 mg/dL (ref >=40)
LDL Cholesterol (Calc): 86 mg/dL
Non-HDL Cholesterol (Calc): 111 mg/dL (ref <130)
Total CHOL/HDL Ratio: 3.7 (calc) (ref <5.0)
Triglycerides: 155 mg/dL — ABNORMAL HIGH (ref <150)

## 2024-09-26 LAB — RPR: RPR Ser Ql: REACTIVE — AB

## 2024-09-26 LAB — HIV-1 RNA QUANT-NO REFLEX-BLD
HIV 1 RNA Quant: NOT DETECTED {copies}/mL
HIV-1 RNA Quant, Log: NOT DETECTED {Log_copies}/mL

## 2024-09-26 LAB — T PALLIDUM AB: T Pallidum Abs: POSITIVE — AB

## 2024-09-26 LAB — RPR TITER: RPR Titer: 1:8 {titer} — ABNORMAL HIGH
